# Patient Record
Sex: Female | Born: 1935 | Race: White | Hispanic: No | Smoking: Never smoker
Health system: Southern US, Community
[De-identification: ages and names within clinical notes are randomized; demographics above are authoritative.]

## PROBLEM LIST (undated history)

## (undated) DIAGNOSIS — E039 Hypothyroidism, unspecified: Secondary | ICD-10-CM

## (undated) DIAGNOSIS — M1990 Unspecified osteoarthritis, unspecified site: Secondary | ICD-10-CM

## (undated) DIAGNOSIS — I350 Nonrheumatic aortic (valve) stenosis: Secondary | ICD-10-CM

## (undated) DIAGNOSIS — E785 Hyperlipidemia, unspecified: Secondary | ICD-10-CM

## (undated) DIAGNOSIS — F0390 Unspecified dementia without behavioral disturbance: Secondary | ICD-10-CM

## (undated) DIAGNOSIS — N200 Calculus of kidney: Secondary | ICD-10-CM

## (undated) DIAGNOSIS — H401190 Primary open-angle glaucoma, unspecified eye, stage unspecified: Secondary | ICD-10-CM

## (undated) DIAGNOSIS — I639 Cerebral infarction, unspecified: Secondary | ICD-10-CM

## (undated) DIAGNOSIS — H3530 Unspecified macular degeneration: Secondary | ICD-10-CM

## (undated) HISTORY — PX: CATARACT EXTRACTION: SUR2

## (undated) HISTORY — PX: BREAST LUMPECTOMY: SHX2

## (undated) HISTORY — PX: CHOLECYSTECTOMY: SHX55

---

## 2001-08-05 ENCOUNTER — Encounter: Admission: RE | Admit: 2001-08-05 | Discharge: 2001-08-05 | Payer: Self-pay | Admitting: Family Medicine

## 2001-08-05 ENCOUNTER — Encounter: Payer: Self-pay | Admitting: Family Medicine

## 2003-04-15 DIAGNOSIS — I639 Cerebral infarction, unspecified: Secondary | ICD-10-CM

## 2003-04-15 HISTORY — DX: Cerebral infarction, unspecified: I63.9

## 2003-05-19 ENCOUNTER — Observation Stay (HOSPITAL_COMMUNITY): Admission: EM | Admit: 2003-05-19 | Discharge: 2003-05-21 | Payer: Self-pay | Admitting: Internal Medicine

## 2003-05-21 ENCOUNTER — Encounter: Payer: Self-pay | Admitting: Internal Medicine

## 2005-09-11 ENCOUNTER — Encounter: Payer: Self-pay | Admitting: Internal Medicine

## 2005-09-11 ENCOUNTER — Ambulatory Visit: Payer: Self-pay

## 2006-12-29 ENCOUNTER — Observation Stay (HOSPITAL_COMMUNITY): Admission: AD | Admit: 2006-12-29 | Discharge: 2007-01-01 | Payer: Self-pay | Admitting: Internal Medicine

## 2006-12-29 ENCOUNTER — Ambulatory Visit: Payer: Self-pay | Admitting: Cardiology

## 2006-12-30 ENCOUNTER — Encounter (INDEPENDENT_AMBULATORY_CARE_PROVIDER_SITE_OTHER): Payer: Self-pay | Admitting: Internal Medicine

## 2008-09-17 ENCOUNTER — Emergency Department (HOSPITAL_COMMUNITY): Admission: EM | Admit: 2008-09-17 | Discharge: 2008-09-17 | Payer: Self-pay | Admitting: Emergency Medicine

## 2009-09-06 ENCOUNTER — Emergency Department (HOSPITAL_COMMUNITY): Admission: EM | Admit: 2009-09-06 | Discharge: 2009-09-06 | Payer: Self-pay | Admitting: Emergency Medicine

## 2009-09-14 ENCOUNTER — Ambulatory Visit: Payer: Self-pay | Admitting: Internal Medicine

## 2010-05-07 ENCOUNTER — Inpatient Hospital Stay (HOSPITAL_COMMUNITY)
Admission: EM | Admit: 2010-05-07 | Discharge: 2010-05-17 | DRG: 291 | Disposition: A | Discharge status: Skilled Nursing Facility | Payer: Medicare Other | Attending: Internal Medicine | Admitting: Internal Medicine

## 2010-05-07 ENCOUNTER — Emergency Department: Payer: Self-pay | Admitting: Emergency Medicine

## 2010-05-07 DIAGNOSIS — E785 Hyperlipidemia, unspecified: Secondary | ICD-10-CM | POA: Diagnosis present

## 2010-05-07 DIAGNOSIS — I359 Nonrheumatic aortic valve disorder, unspecified: Secondary | ICD-10-CM | POA: Diagnosis present

## 2010-05-07 DIAGNOSIS — Z7982 Long term (current) use of aspirin: Secondary | ICD-10-CM

## 2010-05-07 DIAGNOSIS — N184 Chronic kidney disease, stage 4 (severe): Secondary | ICD-10-CM | POA: Diagnosis present

## 2010-05-07 DIAGNOSIS — Z66 Do not resuscitate: Secondary | ICD-10-CM | POA: Diagnosis present

## 2010-05-07 DIAGNOSIS — B373 Candidiasis of vulva and vagina: Secondary | ICD-10-CM | POA: Diagnosis present

## 2010-05-07 DIAGNOSIS — F3289 Other specified depressive episodes: Secondary | ICD-10-CM | POA: Diagnosis present

## 2010-05-07 DIAGNOSIS — M810 Age-related osteoporosis without current pathological fracture: Secondary | ICD-10-CM | POA: Diagnosis present

## 2010-05-07 DIAGNOSIS — I509 Heart failure, unspecified: Principal | ICD-10-CM | POA: Diagnosis present

## 2010-05-07 DIAGNOSIS — M1990 Unspecified osteoarthritis, unspecified site: Secondary | ICD-10-CM | POA: Diagnosis present

## 2010-05-07 DIAGNOSIS — K5900 Constipation, unspecified: Secondary | ICD-10-CM | POA: Diagnosis present

## 2010-05-07 DIAGNOSIS — H3530 Unspecified macular degeneration: Secondary | ICD-10-CM | POA: Diagnosis present

## 2010-05-07 DIAGNOSIS — R5381 Other malaise: Secondary | ICD-10-CM | POA: Diagnosis present

## 2010-05-07 DIAGNOSIS — B3731 Acute candidiasis of vulva and vagina: Secondary | ICD-10-CM | POA: Diagnosis present

## 2010-05-07 DIAGNOSIS — I5022 Chronic systolic (congestive) heart failure: Secondary | ICD-10-CM | POA: Diagnosis present

## 2010-05-07 DIAGNOSIS — F068 Other specified mental disorders due to known physiological condition: Secondary | ICD-10-CM | POA: Diagnosis present

## 2010-05-07 DIAGNOSIS — D509 Iron deficiency anemia, unspecified: Secondary | ICD-10-CM | POA: Diagnosis present

## 2010-05-07 DIAGNOSIS — F329 Major depressive disorder, single episode, unspecified: Secondary | ICD-10-CM | POA: Diagnosis present

## 2010-05-07 DIAGNOSIS — Z8673 Personal history of transient ischemic attack (TIA), and cerebral infarction without residual deficits: Secondary | ICD-10-CM

## 2010-05-07 DIAGNOSIS — R627 Adult failure to thrive: Secondary | ICD-10-CM | POA: Diagnosis present

## 2010-05-07 DIAGNOSIS — J189 Pneumonia, unspecified organism: Secondary | ICD-10-CM | POA: Diagnosis present

## 2010-05-07 DIAGNOSIS — E039 Hypothyroidism, unspecified: Secondary | ICD-10-CM | POA: Diagnosis present

## 2010-05-08 LAB — CBC
Hemoglobin: 10.2 g/dL — ABNORMAL LOW (ref 12.0–15.0)
MCH: 31.2 pg (ref 26.0–34.0)
MCV: 94.8 fL (ref 78.0–100.0)
Platelets: 175 10*3/uL (ref 150–400)
RBC: 3.27 MIL/uL — ABNORMAL LOW (ref 3.87–5.11)

## 2010-05-08 LAB — COMPREHENSIVE METABOLIC PANEL
Alkaline Phosphatase: 41 U/L (ref 39–117)
BUN: 27 mg/dL — ABNORMAL HIGH (ref 6–23)
CO2: 24 mEq/L (ref 19–32)
Chloride: 96 mEq/L (ref 96–112)
GFR calc non Af Amer: 39 mL/min — ABNORMAL LOW (ref >60)
Glucose, Bld: 105 mg/dL — ABNORMAL HIGH (ref 70–99)
Potassium: 3.3 mEq/L — ABNORMAL LOW (ref 3.5–5.1)
Total Bilirubin: 0.7 mg/dL (ref 0.3–1.2)

## 2010-05-08 LAB — BRAIN NATRIURETIC PEPTIDE: Pro B Natriuretic peptide (BNP): 728 pg/mL — ABNORMAL HIGH (ref 0.0–100.0)

## 2010-05-08 LAB — PROCALCITONIN: Procalcitonin: 1.58 ng/mL

## 2010-05-09 LAB — BASIC METABOLIC PANEL
BUN: 31 mg/dL — ABNORMAL HIGH (ref 6–23)
CO2: 27 mEq/L (ref 19–32)
Chloride: 96 mEq/L (ref 96–112)
Creatinine, Ser: 1.45 mg/dL — ABNORMAL HIGH (ref 0.4–1.2)
Potassium: 3.6 mEq/L (ref 3.5–5.1)

## 2010-05-09 LAB — CBC
Hemoglobin: 9.7 g/dL — ABNORMAL LOW (ref 12.0–15.0)
MCH: 31 pg (ref 26.0–34.0)
MCV: 94.2 fL (ref 78.0–100.0)
RBC: 3.13 MIL/uL — ABNORMAL LOW (ref 3.87–5.11)

## 2010-05-10 LAB — CBC
HCT: 27.8 % — ABNORMAL LOW (ref 36.0–46.0)
MCH: 31.4 pg (ref 26.0–34.0)
MCV: 94.9 fL (ref 78.0–100.0)
Platelets: 213 10*3/uL (ref 150–400)
RBC: 2.93 MIL/uL — ABNORMAL LOW (ref 3.87–5.11)

## 2010-05-10 LAB — BASIC METABOLIC PANEL
CO2: 27 mEq/L (ref 19–32)
Calcium: 8.6 mg/dL (ref 8.4–10.5)
GFR calc non Af Amer: 37 mL/min — ABNORMAL LOW (ref >60)
Potassium: 3.6 mEq/L (ref 3.5–5.1)

## 2010-05-11 LAB — BASIC METABOLIC PANEL
CO2: 25 mEq/L (ref 19–32)
Chloride: 96 mEq/L (ref 96–112)
GFR calc Af Amer: 46 mL/min — ABNORMAL LOW (ref >60)
Sodium: 133 mEq/L — ABNORMAL LOW (ref 135–145)

## 2010-05-11 LAB — CBC
HCT: 27.5 % — ABNORMAL LOW (ref 36.0–46.0)
Hemoglobin: 9.2 g/dL — ABNORMAL LOW (ref 12.0–15.0)
RBC: 2.93 MIL/uL — ABNORMAL LOW (ref 3.87–5.11)

## 2010-05-11 LAB — TYPE AND SCREEN: Antibody Screen: NEGATIVE

## 2010-05-11 LAB — ABO/RH: ABO/RH(D): O POS

## 2010-05-12 LAB — COMPREHENSIVE METABOLIC PANEL
AST: 43 U/L — ABNORMAL HIGH (ref 0–37)
Albumin: 2.3 g/dL — ABNORMAL LOW (ref 3.5–5.2)
Chloride: 94 mEq/L — ABNORMAL LOW (ref 96–112)
Creatinine, Ser: 1.27 mg/dL — ABNORMAL HIGH (ref 0.4–1.2)
GFR calc Af Amer: 48 mL/min — ABNORMAL LOW (ref >60)
Potassium: 4 mEq/L (ref 3.5–5.1)
Total Bilirubin: 0.4 mg/dL (ref 0.3–1.2)

## 2010-05-12 LAB — CBC
MCH: 31.4 pg (ref 26.0–34.0)
Platelets: 283 10*3/uL (ref 150–400)
RBC: 2.83 MIL/uL — ABNORMAL LOW (ref 3.87–5.11)
WBC: 8.5 10*3/uL (ref 4.0–10.5)

## 2010-05-12 LAB — DIFFERENTIAL
Basophils Absolute: 0.1 10*3/uL (ref 0.0–0.1)
Eosinophils Absolute: 0.6 10*3/uL (ref 0.0–0.7)
Lymphocytes Relative: 14 % (ref 12–46)
Neutrophils Relative %: 68 % (ref 43–77)

## 2010-05-12 NOTE — H&P (Signed)
[name] NO.:  [id number]  MEDICAL RECORD NO.:  [id number]          PATIENT TYPE:  INP  LOCATION:  2920                         FACILITY:  [facility]  PHYSICIAN:  Gina Pounds, MD       DATE OF BIRTH:  [birth year]  DATE OF ADMISSION:  05/07/2010 DATE OF DISCHARGE:                             HISTORY & PHYSICAL   PRIMARY CARE PROVIDER:  Gwen Pounds, MD  CARDIOLOGIST:  Gina Buckles. Bensimhon, MD  CHIEF COMPLAINT:  Short of breath, dyspnea on exertion, hypoxia, congestive heart failure, increased troponin I, and pneumonia.  HISTORY OF PRESENT ILLNESS:  A 77 year old female with critical aortic stenosis, last valve area was 0.74 in 2008, known history of dementia, history of stroke, hypothyroidism, failure to thrive, and multiple other medical issues.  Had GI bug including diarrhea, nausea, vomiting over the weekend.  Presents today at [facility] with 84% sats, rhonchi, dyspnea on exertion, respiratory rate of 36, and moderate respiratory distress, DuoNeb given.  Chest x-ray confirmed congestive heart failure versus pneumonia versus both.  BNP was 16,000.  Sats came up to 90% and then higher with treatment.  Troponin I was elevated and she was given Rocephin, azithromycin, and blood cultures were obtained. I was called as family requested.  The family requested that the patient be moved close to home and I accepted her over to [facility].  Here in the Step-Down Unit after arrival, she is on 100% nonrebreather.  She is still tachypneic and ill, although she is mentally alert, answering questions, and actually making some jokes.  She will be admitted for evaluation and treatment and comfort measures.  The plan right now is to provide her with the tools to get better.  If she gets better, wonderful.  If she does not, progress to a Hospice Palliative Care approach.  The heparin drip and the nitroglycerin that she was on will be stopped  and she only will be on Lovenox.  The tools that will be provided will be just short of pressors, just short of intubation, and she is do not resuscitate, this is confirmed.  PAST MEDICAL HISTORY: 1. Critical aortic stenosis with mild aortic insufficiency, last valve     area is 0.74. 2. Failure to thrive. 3. Deconditioning. 4. Dementia. 5. Mild depression. 6. Iron deficiency anemia. 7. Sundowning with some agitation, controlled on Seroquel. 8. 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 9. Open-angle glaucoma. 10.Hyperlipidemia. 11.History nephrolithiasis. 12.Macular degeneration. 13.Osteoarthritis. 14.Cataract surgery. 15.Benign breast biopsy. 16.Osteoporosis. 17.Vitamin D deficiency. 18.History of urinary tract infections. 19.History of L1 compression fracture.  ALLERGIES:  PENICILLIN, flu and strep and TRAMADOL.  MEDICATION LIST: 1. Aspirin 325 daily. 2. Vitamin C 500 daily. 3. Iron 325 daily. 4. Synthroid 112 daily. 5. Timolol 0.5 one drop, both eyes at bedtime. 6. Travatan 0.004 solution daily. 7. Vitamin D 50,000 weekly. 8. Miacalcin nasal spray alternate nostrils on every other day basis. 9. Tylenol 1000 t.i.d. 10.Vitamin D 1000 2 tablets daily. 11.Multivitamin 1 daily. 12.Zoloft 25 daily. 13.MiraLax as directed. 14.Seroquel 25 at  bedtime.  SOCIAL HISTORY:  [name] is her daughter, telephone number is [phone], that is her cell phone.  She resides at the [facility] in [place].  FAMILY HISTORY:  Father died at the age of 90 of an accident.  Mother died at the age of 8 of stroke and diabetes.  Sibling with diabetes and coronary disease.  REVIEW OF SYSTEMS:  Recent nausea, vomiting, diarrhea, GI bug.  She denies any fevers, chills, or night sweats.  About 3-1/2 years ago, she had significant weight loss, but her weight has been relatively stable, last weight in my office was 161 Acosta almost 7-8 months ago, and she has actually gained about  8 Acosta in the last few months.  She wears glasses.  She has dementia and is having dyspnea on exertion, orthopnea, and current short of breath.  All other review of systems were obtained and negative.  PHYSICAL EXAMINATION:  VITAL SIGNS:  Temperature 98.2, heart rate is 84, respiratory rate is 22, blood pressure is 158/83, sats are 98% on 100% nonrebreather. GENERAL:  Alert, awake.  She is talking.  She has got underlying dementia. HEENT:  Oropharynx is dry. NECK:  No JVD. PULMONARY:  Positive rales, positive crackles. CARDIAC:  Regular with a whistling tight murmur. ABDOMEN:  Soft. EXTREMITIES:  No edema but is tender in the right shin without any corresponding element.  Ancillary data shows BNP is 16,886, troponin I is 0.36.  White count 6.7, hemoglobin 10.7, platelet count 165.  Sodium 135, potassium 3.7, chloride 101, bicarb 26, BUN 36, creatinine 1.51, glucose 124, calcium 8.5.  Alk phos 42, ALT 16, AST 33, albumin 3.2.  Urinalysis negative. Chest x-ray; congestive heart failure, pulmonary edema, and pneumonia. EKG shows normal sinus rhythm, it is actually incredibly normal looking EKG.  ASSESSMENT:  This is an elderly female being admitted with congestive heart failure, question low-grade myocardial infarction in the setting of critical aortic stenosis and what seems to be a clinical pneumonia after recent GI bug, question underlying aspiration.  PLAN: 1. She is already admitted to Step-Down, we will continue that.  We     will continue supportive care.  Her current critical aortic     stenosis is certainly nonoperable.  Her last Cardiology evaluation     was with Dr. Gala Romney and that was remarkably 3 plus years ago.     She will be maintained on do not resuscitate, that order has been     signed. 2. Antibiotics have already been given.  She will be continued on     Rocephin and azithromycin.  She will be given nebs.  She will be     given pulmonary toilet.  We will  follow up on cultures.  She will     be continued on oxygen to maintain her sats well above 90. 3. We will follow up on labs.  Labs ordered for the morning include     procalcitonin, BNP, TSH, CK, troponin I, CMET, CBC.  EKG and chest     x-ray will be obtained in the morning as well.  I will change the     heparin drip to Lovenox as she is not a long-term anticoagulation     patient and she is not having current chest pain. 4. We will rule out MI as stated above. 5. Hopefully, most of this is pneumonia and she will respond to the     treatments listed. 6. We will attempt to manage fluid status, it is very  difficult as she     does not have JVD.  She does not have lower extremity edema.  I am     not able to actually look at the x-ray that was done at [facility]     but based on the BNP and based on how bad her critical aortic     stenosis is with presumed increasing closure of the valve over     time.  I do not want to over diurese her. I want to provide     her with enough diuresis that we improve her respiratory function.     It is going to be very difficult to carefully balance this and we     will do our best. 7. If she survives this hospital stay, we might have to increase her     level of care and she might need a long-term care facility, nursing     facility. 8. We will follow her creatinine underlying kidney disease as her last     creatinine in my office was 1.4 and currently at 1.5, this is not     big deal and not far away from her baseline. 9. Her hemoglobin is 10.7 and that is stable. 10.Her white count is 6.7, it is certainly not that worrisome either. 11.She has a high mortality risk, family is aware, and again we are     going to provide her with the tools to get better.  If she is able     to get better, we will support.  If she is not, we will progress to     hospice palliative care approach.     Gina Pounds, MD     JMR/MEDQ  D:  05/07/2010  T:  05/08/2010   Job:  [id number]  cc:   Gina Buckles. Bensimhon, MD  Electronically Signed by Creola Corn MD on 05/12/2010 08:29:37 PM

## 2010-05-13 LAB — COMPREHENSIVE METABOLIC PANEL
Alkaline Phosphatase: 51 U/L (ref 39–117)
BUN: 35 mg/dL — ABNORMAL HIGH (ref 6–23)
GFR calc non Af Amer: 37 mL/min — ABNORMAL LOW (ref >60)
Glucose, Bld: 98 mg/dL (ref 70–99)
Potassium: 4 mEq/L (ref 3.5–5.1)
Total Bilirubin: 0.4 mg/dL (ref 0.3–1.2)
Total Protein: 6.5 g/dL (ref 6.0–8.3)

## 2010-05-13 LAB — DIFFERENTIAL
Basophils Absolute: 0.1 10*3/uL (ref 0.0–0.1)
Eosinophils Absolute: 0.6 10*3/uL (ref 0.0–0.7)
Lymphocytes Relative: 12 % (ref 12–46)
Neutrophils Relative %: 68 % (ref 43–77)

## 2010-05-13 LAB — CBC
Platelets: 310 10*3/uL (ref 150–400)
RBC: 2.94 MIL/uL — ABNORMAL LOW (ref 3.87–5.11)
WBC: 7 10*3/uL (ref 4.0–10.5)

## 2010-05-14 LAB — COMPREHENSIVE METABOLIC PANEL
ALT: 42 U/L — ABNORMAL HIGH (ref 0–35)
Alkaline Phosphatase: 53 U/L (ref 39–117)
BUN: 37 mg/dL — ABNORMAL HIGH (ref 6–23)
CO2: 27 mEq/L (ref 19–32)
Calcium: 9 mg/dL (ref 8.4–10.5)
GFR calc non Af Amer: 38 mL/min — ABNORMAL LOW (ref >60)
Glucose, Bld: 99 mg/dL (ref 70–99)
Sodium: 136 mEq/L (ref 135–145)

## 2010-05-14 LAB — CBC
HCT: 28.6 % — ABNORMAL LOW (ref 36.0–46.0)
Hemoglobin: 9.1 g/dL — ABNORMAL LOW (ref 12.0–15.0)
WBC: 7.9 10*3/uL (ref 4.0–10.5)

## 2010-05-15 ENCOUNTER — Inpatient Hospital Stay (HOSPITAL_COMMUNITY): Payer: Medicare Other

## 2010-05-15 LAB — CBC
HCT: 27.3 % — ABNORMAL LOW (ref 36.0–46.0)
Platelets: 334 10*3/uL (ref 150–400)
RBC: 2.82 MIL/uL — ABNORMAL LOW (ref 3.87–5.11)
RDW: 12.5 % (ref 11.5–15.5)
WBC: 8 10*3/uL (ref 4.0–10.5)

## 2010-05-15 LAB — COMPREHENSIVE METABOLIC PANEL
ALT: 38 U/L — ABNORMAL HIGH (ref 0–35)
Albumin: 2.4 g/dL — ABNORMAL LOW (ref 3.5–5.2)
Alkaline Phosphatase: 53 U/L (ref 39–117)
Chloride: 98 mEq/L (ref 96–112)
Potassium: 4.4 mEq/L (ref 3.5–5.1)
Sodium: 136 mEq/L (ref 135–145)
Total Bilirubin: 0.3 mg/dL (ref 0.3–1.2)
Total Protein: 6.7 g/dL (ref 6.0–8.3)

## 2010-05-16 LAB — CBC
HCT: 27.6 % — ABNORMAL LOW (ref 36.0–46.0)
Hemoglobin: 9 g/dL — ABNORMAL LOW (ref 12.0–15.0)
RBC: 2.88 MIL/uL — ABNORMAL LOW (ref 3.87–5.11)
RDW: 12.5 % (ref 11.5–15.5)
WBC: 7 10*3/uL (ref 4.0–10.5)

## 2010-05-16 LAB — COMPREHENSIVE METABOLIC PANEL
BUN: 28 mg/dL — ABNORMAL HIGH (ref 6–23)
CO2: 29 mEq/L (ref 19–32)
Calcium: 8.8 mg/dL (ref 8.4–10.5)
Creatinine, Ser: 1.14 mg/dL (ref 0.4–1.2)
GFR calc non Af Amer: 44 mL/min — ABNORMAL LOW (ref >60)
Glucose, Bld: 86 mg/dL (ref 70–99)

## 2010-05-22 NOTE — Discharge Summary (Signed)
NAMEDELANY, [name] NO.:  [id number]  MEDICAL RECORD NO.:  [id number]           PATIENT TYPE:  I  LOCATION:  5502                         FACILITY:  [facility]  PHYSICIAN:  Gwen Pounds, MD       DATE OF BIRTH:  [birth year]  DATE OF ADMISSION:  05/07/2010 DATE OF DISCHARGE:  05/17/2010                              DISCHARGE SUMMARY   PRIMARY CARE PROVIDER:  Gwen Pounds, MD  CARDIOLOGIST:  Bevelyn Buckles. Bensimhon, MD  DISCHARGE DIAGNOSES:  Include: 1. Pneumonia. 2. Hypoxia requiring oxygen. 3. Recurrent aspiration events. 4. Critical and severe aortic stenosis, not a surgical candidate. 5. Cough. 6. Mild congestive heart failure. 7. Deconditioning. 8. Yeast rash in the perineum. 9. Chronic dysphagia. 10.Chronic and stable anemia. 11.Constipation. 12.Dementia. 13.Osteoporosis with history of compression fractures. 14.Failure to thrive. 15.Mild depression. 16.History of sundowning with some agitation, controlled on Seroquel. 17.History of cerebrovascular accident and left internal capsule     cerebrovascular accident in February 2005. 18.Open-angle glaucoma. 19.Hyperlipidemia. 20.History of nephrolithiasis. 21.Macular degeneration. 22.Osteoarthritis. 23.History of cataract surgery. 24.Benign breast biopsy. 25.Vitamin D deficiency. 26.History of urinary tract infections. 27.Status post L1 compression fracture.  DISCHARGE MEDICATION:  List includes: 1. Tylenol 325 mg 1-2 tablets every 6 hours as needed for pain or     fever. 2. Sertraline 25 mg p.o. daily. 3. Vitamin C 500 daily. 4. Iron 325 daily. 5. Loperamide 2 mg 1-2 capsules daily as needed for diarrhea. 6. Albuterol and/or ipratropium nebulizer treatments q.4-6 h p.r.n.     shortness of breath, cough, or congestion. 7. Incentive spirometry q.1 hour while awake. 8. Oxygen 2 liters nasal cannula continuous with the proposed slow and     long wean. 9. Seroquel 25 mg at bedtime. 10.Timolol  ophthalmic solution 0.5% drops 1 drop each eye at bedtime. 11.Ensure vanilla flavor one t.i.d. with meals. 12.Docusate sodium 100 mg 2 tablets at bedtime, hold for diarrhea. 13.MiraLax 17 g in 88 ounces of water q.a.m., hold if diarrhea or     regular. 14.Vicodin 5/500 one tablet q.6 h p.r.n. pain. 15.Travatan ophthalmologic solution 0.004% one drop each eye at     bedtime. 16.Vitamin D 50,000 units weekly. 17.Miacalcin nasal spray alternate nostrils on an every-other-day     basis. 18.Vitamin D 1000 units 2 tablets daily on top of the 50,000 weekly. 19.Phenergan 25 mg every 6 hours p.r.n. extreme nausea. 20.Zofran 4 mg orally every 8 hours as needed for mild to moderate     nausea. 21.Nystatin 100,000 units per gram powder applied b.i.d. to perennial     area as needed for yeast infection.  PROCEDURES:  Include medical management and multiple contiguous x-rays.  HISTORY OF PRESENT ILLNESS:  Briefly, [name] is a longstanding patient of mine who is 77 years old with critical AS with her last valve area of 0.74 in 2008, history of mild-to-moderate dementia, history of stroke, who had a GI viral infection the weekend prior to admission including diarrhea, nausea, and vomiting.  She presented to [facility] on May 07, 2010 with 84% sats, rhonchi, dyspnea  on exertion, mild respiratory distress with respiratory rate 36.  DuoNeb was given.  Chest x-ray confirmed congestive heart failure versus pneumonia versus both.  With treatment, her oxygen sats came up to 90% or higher.  Due to her family being in [place] and that this may be a terminal event, I was called and family requested the transfer to [facility], which I gladly accepted.  We moved her to step-down.  I saw her that night.  She was on 100% non-rebreather.  She was tachypneic.  She was ill, although she was mentally alert, answering questions, and actually making some jokes.  The initial plan  was to provide her with antibiotics and pulmonary toilet and oxygen and do not escalate care beyond that and use those tools to see if she will get better and if she does wonderful and if she does not, then we would progress to hospice palliative care approach.  She was placed on heparin drip at the outside facility.  She was placed on nitroglycerin drip at the outside facility.  These were both stopped and changed to Lovenox as she was not really going to be treated for anything cardiac at that point.  The reason why those medicines were initially used is her BNP was extremely elevated and the troponin I was 0.36.  The rest of her labs at that time showed a white count of 6.7, hemoglobin 10.7, creatinine 1.51 and the rest of her labs were unremarkable.  Her EKG was also unremarkable.  CODE STATUS:  She has gotten out of facility DNR signed and we will go with the patient.  HOSPITAL COURSE:  [name] is an elderly female admitted on May 07, 2010 with congestive heart failure, questionable low-grade myocardial infarction in the setting of critical aortic stenosis and a clinical pneumonia after a recent GI bug with question of underlying aspiration.She was admitted to the step-down unit.  She was given IV Rocephin, IV azithromycin, and supportive care.  She was placed on aggressive pulmonary toilet.  Remarkably over the next several days to little over a week, she actually improved.  First x-ray did show bibasilar opacities, left greater than right and over the next several x-rays showed continued regression of the left lung base.  The last chest x-ray that was on the hospital on May 15, 2010 showed interval clearing of patchy changes to the right lung, persistent patchy changes to the left mid to left lower lobe may represent infiltrate in the proper clinical setting.  Pulmonary vascular congestion.  Cardiomegaly.  Tortuous calcified aorta.  Over the course of the hospital  stay, she was seen and evaluated by respiratory therapy, who recommended a dysphagia III diet with thin liquids, which equals a mechanical soft diet with upright posture when alert and supervision while eating.  She is deemed not to be a peg tube candidate.  She had several witnessed aspiration events throughout the hospital stay and clearly this is going to be an issue over the course of time and we may need to thicken some of the liquids, but at this point, she seems to be slowly improving.  She has got nonoperable critical and severe aortic stenosis.  Her last valve area was pretty poor 3+ years ago.  Her last cardiology evaluation was at that time as well.  There is nothing further to do with this aortic valve.  She has got more interesting murmurs here.  She was maintained a do not resuscitate and was intermittently  diuresed based on congestive heart failure related to some of her cardiopulmonary issues. Her current fluid status is stable at the current time.  She did receive Lasix after that last x-ray only 10 mg and she tolerated it well.  Again, she did survive.  She did do better with originally anticipated than expected and physical and occupational therapy did see her for her underlying deconditioning and recommended skilled nursing facility on discharge.  She was able to work with them.  She does have some underlying dementia and she is making progress.  Her ultimate goal is to end up back at the [facility] in [place] at some point in the future.  She remains hypoxic.  She was weaned off her oxygen yesterday and had sats greater than 90%.  Unfortunately, overnight on May 16, 2010 and May 17, 2010, she had a little bit of nausea and some IV Zofran was given and then later in the morning, she got anxious, she got little short of breath, and her sats were 88% on room air.  She got a breathing treatment and was placed back on the oxygen and by the time I am seeing her  around 9 a.m. in the morning, she is on FIO2 at 2.5 liters and her sats are fine.  She is no longer anxious.  She is no longer short of breath.  She looks back to her baseline.  All her vital signs are stable.  She is alert and comfortable.  Her blood pressure currently is 117/67.  Her rhonchi is the same as usual.  She is not really tachypneic.  Her murmurs is the same, really has not changed.  The rest of her physical exam is unremarkable.  PLAN:  Currently for her pneumonia, she is going to finish her Rocephin today and will not need any further outpatient antibiotics.  For her aspiration events, we are going to do the best we can on the dysphagia III diet with thin liquids and monitor for further events and potentially do speech therapy at the skilled nursing facility.  For her hypoxemia, she is going to stay on the FIO2 and have a slow wean.  For her aortic stenosis, we are just going to continue DNR and supportive care.  For her deconditioning, it is okay for her to be discharged to skilled nursing facility today.  She is currently at her baseline for the hospitalization at the moment and hopefully, she will continue to improve and maybe get her wish in terms of long-term recovery.  The nurse brought up to me that she has got a yeasty rash in her groin.  I took a look at it, it is raw and irritated.  She will get one dose of Diflucan followed by nystatin powder.  Important laboratory data throughout this hospital stay shows MRSA screening was negative.  Hemoglobins have been between 9.0 and 10.2. Creatinine were between 1.14, which was the last creatinine checked and 1.5.  She is currently stable in that regard.  She has slight elevation of her LFTs and those have come back down to normal.  She was typed and screened, and she is O+ for the possibility that she may have needed a transfusion during the hospital stay and that never actually did occur or need to happen.  TSH was done  on May 08, 2010 was 1.9 and stable. Her peak troponin was 0.15 with CK, CK-MB, and index all normal.  Pro calcitonin level was 1.58 and beta-natriuretic peptide was 728  when checked.  [name] again, all of her vital signs are stable and she looks great at the moment.  Do not know really what happened to her overnight, whether she got nausea and had the side effects of the Zofran while being still relatively hypoxic and potentially the small aspiration event, but she is clinically stable at this current time and ready for discharge.     Gwen Pounds, MD     JMR/MEDQ  D:  05/17/2010  T:  05/17/2010  Job:  [id number]  cc:   Bevelyn Buckles. Bensimhon, MD  Electronically Signed by Creola Corn MD on 05/22/2010 09:55:37 AM

## 2010-05-25 ENCOUNTER — Emergency Department (HOSPITAL_COMMUNITY): Admission: EM | Admit: 2010-05-25 | Payer: Medicare Other | Source: Home / Self Care

## 2010-05-25 ENCOUNTER — Emergency Department (HOSPITAL_COMMUNITY): Payer: Medicare Other

## 2010-07-01 LAB — COMPREHENSIVE METABOLIC PANEL
BUN: 23 mg/dL (ref 6–23)
CO2: 25 mEq/L (ref 19–32)
Chloride: 102 mEq/L (ref 96–112)
Creatinine, Ser: 1.28 mg/dL — ABNORMAL HIGH (ref 0.4–1.2)
GFR calc non Af Amer: 39 mL/min — ABNORMAL LOW (ref >60)
Total Bilirubin: 0.5 mg/dL (ref 0.3–1.2)

## 2010-07-01 LAB — DIFFERENTIAL
Basophils Absolute: 0 10*3/uL (ref 0.0–0.1)
Lymphocytes Relative: 14 % (ref 12–46)
Neutro Abs: 7.7 10*3/uL (ref 1.7–7.7)
Neutrophils Relative %: 77 % (ref 43–77)

## 2010-07-01 LAB — CBC
HCT: 34.8 % — ABNORMAL LOW (ref 36.0–46.0)
MCV: 93.8 fL (ref 78.0–100.0)
Platelets: 246 10*3/uL (ref 150–400)
RBC: 3.72 MIL/uL — ABNORMAL LOW (ref 3.87–5.11)
WBC: 9.9 10*3/uL (ref 4.0–10.5)

## 2010-07-01 LAB — URINALYSIS, ROUTINE W REFLEX MICROSCOPIC
Hgb urine dipstick: NEGATIVE
Specific Gravity, Urine: 1.023 (ref 1.005–1.030)
Urobilinogen, UA: 0.2 mg/dL (ref 0.0–1.0)
pH: 6 (ref 5.0–8.0)

## 2010-07-01 LAB — URINE CULTURE

## 2010-07-01 LAB — URINE MICROSCOPIC-ADD ON

## 2010-07-22 LAB — BASIC METABOLIC PANEL
Chloride: 102 mEq/L (ref 96–112)
GFR calc Af Amer: 45 mL/min — ABNORMAL LOW (ref >60)
Potassium: 4.1 mEq/L (ref 3.5–5.1)

## 2010-07-22 LAB — CBC
HCT: 36.1 % (ref 36.0–46.0)
MCV: 91.7 fL (ref 78.0–100.0)
RBC: 3.94 MIL/uL (ref 3.87–5.11)
WBC: 6.9 10*3/uL (ref 4.0–10.5)

## 2010-07-22 LAB — DIFFERENTIAL
Basophils Absolute: 0 10*3/uL (ref 0.0–0.1)
Lymphocytes Relative: 22 % (ref 12–46)
Monocytes Absolute: 0.6 10*3/uL (ref 0.1–1.0)
Monocytes Relative: 9 % (ref 3–12)
Neutro Abs: 4.3 10*3/uL (ref 1.7–7.7)

## 2010-08-27 NOTE — Discharge Summary (Signed)
[name], [name]                   ACCOUNT NO.:  [id number]   MEDICAL RECORD NO.:  [id number]          PATIENT TYPE:  OBV   LOCATION:  6715                         FACILITY:  [facility]   PHYSICIAN:  Gwen Pounds, MD       DATE OF BIRTH:  Nov 11, 1914   DATE OF ADMISSION:  12/29/2006  DATE OF DISCHARGE:  12/31/2006                               DISCHARGE SUMMARY   CARDIOLOGIST:  Bevelyn Buckles. Bensimhon, M.D.   DISCHARGE DIAGNOSES:  1. Critical aortic stenosis.  2. Failure to thrive and deconditioning.  3. Weight loss and anorexia while living on her own who is eating      extremely well and thriving well in [facility] hospital.  4. Mild cognitive decline.  5. Depression, presumed mild.  6. Iron-deficiency anemia.  7. History of stroke.  8. Open-angle glaucoma.  9. Hyperlipidemia.  10.History of kidney stones.  11.Macular degeneration.  12.Osteoarthritis.  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  14.Mild aortic insufficiency.  15.History of cataract surgery x2.  16.History of benign breast biopsy.   PROCEDURES:  1. PPD placed on December 31, 2006 at 9:30 a.m. to Gina left forearm.  2. 2-D echo dated December 30, 2006 with ejection fraction 65%.  No      evidence of wall motion abnormalities.  Aortic valve thickness      markedly increased, markedly reduced aortic valve leaflet      excursions.  Gina valve gradient was 46 mmHg.  Gina valve area was      0.74 sq cm.  This was compatible with critical aortic stenosis and      mild aortic insufficiency.  3. Chest x-ray on December 29, 2006.  No active lung disease and      borderline cardiomegaly.  4. EKG on admission shows sinus rhythm with bigeminy.  Telemetry      monitoring just showed normal sinus rhythm.  5. Physical therapy and occupational therapy, speech therapy, and case      worker consults.   Out-of -facility DNR is signed.   DISCHARGE MEDICATIONS:  1. Synthroid 100 mcg p.o.  daily.  2. Lumigan eyedrops one drop bilateral at bedtime.  3. Timolol eye drops one drop bilateral at bedtime.  4. Enteric coated aspirin 325 mg p.o. daily.  5. Vitamin D 50,000 units weekly.  6. Simvastatin 20 mg p.o. daily.  7. Iron sulfate 325 mg p.o. b.i.d. for one month, Gina daily      thereafter.  8. Zoloft 25 mg p.o. daily.  9. Multivitamins one p.o. daily.  10.Strawberry Ensure one can with evening meal.  11.Aranesp given while in [facility] hospital.  12.Hearing loss and wears bilateral hearing aids.   HISTORY OF PRESENT ILLNESS:  Briefly, [name] is a very pleasant  77 year old female who was brought to my attention on December 29, 2006, feeling ill, fatigued, less functional, continued weight loss of  70 to 20 pounds over Gina last one year due to decreased appetite and  decreased consumption p.o.  This  is on a known woman with significant  aortic stenosis.  Differential diagnosis in my office included markedly  worsening valve, worsening anemia with some underlying cancer, potential  dementia, thyroid issues, or underlying illness.  Other considerations  were given side effects of some medications or an underlying viral  illness.  She was admitted for further evaluation and however.  There  was nothing obvious on physical examination when I saw her in [facility] office  and her vital signs were stable.  Her heart rate was 68 and she has  satted 98% on room air.  Again she was admitted for further evaluation  and treatment.   HOSPITAL COURSE:  [name] was admitted via my office on December 29, 2006 with failure to thrive, weakness, anorexia, 20-pound weight  loss, dyspnea on exertion and fatigue.  A thorough check of her  laboratory data was obtained.  BNP was 386.  CK and troponin-I were  negative.  BMET was negative.  CBC was negative.  Her hemoglobin was  10.5, compatible with prior hemoglobins.  Her LFTs were fine except for  a slightly low albumin at 2.9.   Telemetry showed normal sinus rhythm.  Heart rate was 74. Chest x-ray and EKG were unremarkable except for Gina  bigeminy which resolved and orders were obtained for a 2-D  echocardiogram and physical therapy and occupational therapy.  Gina  following morning on rounds, she was seen and other labs were reviewed.  A urinalysis was negative.  B12 level was appropriate.  TSH was  appropriate.  Cancer markers to include CA19-9, CEA and CA125 were all  negative.  Physical examination was otherwise benign.  When doing a mini  mental status exam on her, she has some minor trouble with recall and  knowing what year it is, but is very clear on every other aspect.  So  depression and myocardial infarction were concerned.  She was started on  a low dose of Zoloft and will follow Gina results of this.  She was seen  by physical therapy and occupational therapy and they recommended Gina  patient either go home with 24 hour supervision or assisted living  facility.  At this point I had a conversation with [name] and she  elects to go to assisted living and requests [name].   Nutrition saw Gina patient on December 30, 2006 and said Gina goal was  adequate p.o. with meals and supplement to meet 100% of estimated needs.  She recommended starting on a multivitamin daily.  She recommended that  her current weight be obtained at about 135 pounds.  Also recommend  Strawberry Ensure one can with evening meal and at bedtime.  Gina  following morning on rounds, her labs remained stable except for a  hemoglobin drop just a little bit to 9.5.  Her 2-D echocardiogram was  reviewed and showed critical aortic stenosis.  At that moment, had a  long discussion about what this means.  Both Gina patient and myself have  decided that she is not a surgical candidate.  Gina patient is aware she  had already been made a do not resuscitate and does not want to be  hooked to any machines at all.  Both of realize that her options  are  limited based on her perfect vital signs, I do not necessarily know that  she would get much benefit from adding any medications.  We will need to  avoid diuretics in this patient with  Gina aortic stenosis. This could be  definitely Gina cause of dyspnea on exertion and worsening failure over  time.  Of note, her valve area was about 0.99 about a year ago and it is  0.74 this year.  If it continues to tighten at its current pace,  unfortunately she should get sicker over a pretty short period of time.  I am not going to conjecture as to what that time period will be at this  point.  Will potentially follow this up with another 2-D echocardiogram  in six months to a year.  For Gina weakness and failure to thrive,  anorexia and weight loss, we felt that this was probably all due to  advancing age and early dementia.  And potentially some underlying  depression. A long discussion was had and continued about her going to  assisted living, staying on Gina Zoloft, improving her underlying  nutrition and to be in a facility where she can get Gina care that she  needs and pushed towards Gina thriving.   For her underlying anemia, her hemoglobin was 9.2 on Gina day of  discharge.  RBC folate was 1153.  Ferritin 458.  Haptoglobin 356, B12  445, iron levels and total iron binding capacity were low.  LVH was 136.  I do not necessarily think that she is that great of a colonoscopy  candidate at this point as well.  I ordered down a heme-check of Gina  stools. They were done as an outpatient prior and were all negative, but  I do not have any current results from [facility] hospital.  I do not see that  any were done.  My guess is that [name] went to Gina bathroom and  because she has been so independent in Gina past, did not feel that she  had to tell anybody.  At this point in time, I will just start iron  b.i.d. for a month and then daily thereafter and just try to maintain  her iron levels.   [name] was seen on January 01, 2007.  All her vital signs were  stable.  Temperature 98.9, heart rate 92, respiratory rate 20, blood  pressure 115/73, 98% on room air.  It was deemed she was medically ready  and stable for discharge. Out of facility DNR form was filled out.  Her  [facility] standing orders were filled out.  PPD was confirmed  that it was placed on her left arm Gina prior day and she will be leaving  this morning to afternoon with Gina idea that she will come back and see  me in about three weeks for continued outpatient management.      Gwen Pounds, MD  Electronically Signed     JMR/MEDQ  D:  01/01/2007  T:  01/01/2007  Job:  [id number]

## 2010-08-27 NOTE — H&P (Signed)
[name], [name]                   ACCOUNT NO.:  [id number]   MEDICAL RECORD NO.:  [id number]          PATIENT TYPE:  INP   LOCATION:  6715                         FACILITY:  [facility]   PHYSICIAN:  Gwen Pounds, MD       DATE OF BIRTH:  01/29/1915   DATE OF ADMISSION:  12/29/2006  DATE OF DISCHARGE:                              HISTORY & PHYSICAL   CHIEF COMPLAINT:  Fatigue, sleeping a lot, dyspnea on exertion, dry  cough, weight loss and failure to thrive.   HISTORY OF PRESENT ILLNESS:  This is a 77 year old female, seen in the  office on December 29, 2006 feeling older, giving out all over,  always tired, remaining in bed, sleeping all day, gets up to take her  medicines, goes back to sleep, is tired, gives out, some aches and pains  especially where she had pleurisy.  She has got nasal congestion and  blowing her nose.  She is getting increasing dyspnea on exertion, she is  developing a dry cough and she is failing to thrive and developing  further weakness.  She will be admitted for further evaluation and  treatment.   PAST MEDICAL HISTORY:  1. Hypothyroidism.  2. Glaucoma, open angle.  3. Hyperlipidemia.  4. Kidney stones.  5. Macular degeneration.  6. Anemia with negative stool cards.  7. Osteoarthritis.  8. History of left internal capsule ischemic cerebrovascular accident      in February of 2005.  9. Aortic stenosis and aortic insufficiency.  10.Cataract x2.  11.History of benign breast biopsy.   MEDICATIONS:  Includes:  1. Synthroid 100 mcg per day.  2. Lumigan drops 1 drop both bilateral q.h.s.  3. Timolol 1 drop bilateral q.h.s.  4. Enteric-coated aspirin 325 daily.  5. Vitamin D 50,000 units weekly.  6. Simvastatin 20 mg p.o. daily.   SOCIAL HISTORY:  She lives by herself.  She has some help at home.  She  is widowed, 5 children, 7 grandchildren and 4 great grandchildren.  She  does not smoke.   FAMILY HISTORY:  Father died at the age of 52 of an accident.   Mother  died at the age of 45 of stroke and had diabetes.  Siblings have  diabetes and coronary disease.   ALLERGIES:  INCLUDES:  1. PENICILLIN.  2. SEPTRA.  3. FLU SHOT.   REVIEW OF SYSTEMS:  She denies any fevers, chills or night sweats.  She  has had a 20 pound weight loss in the last year.  She wears glasses, but  has no other ears, nose or throat issues.  She had no skin issues  currently.  She denies any chest pain or shortness of breath, but does  have dyspnea on exertion when she walks.  No orthopnea.  No PND.  She  has got dependent edema at bedtime.  She has got a cough without  wheezing.  She denies any urinary symptoms.  She denies any numbness,  but is weak.  She does not claim to be anxious or depressed.  She denies  any myalgias  or arthralgias, but she does claim to be achy.  She denies  any nausea, vomiting or diarrhea, blood in her stools, reflux or  indigestion.  All other organ systems reviewed and negative.   PHYSICAL EXAMINATION:  VITAL SIGNS:  Temperature 97.3.  Heart rate  anywhere from 44 to 68.  Weight is 132, which is down 3 pounds from last  visit about 1 month ago.  Blood pressure was 104/60.  Saturating 99% on  room air.  HEENT:  PERRL.  EOMI.  Tympanic membranes are clear.  Oropharynx is  without erythema.  Mouth is dry.  NECK: Without  lymphadenopathy.  No thyromegaly.  No bruit noted.  CARDIOVASCULAR:  Regular with a 3/6 systolic ejection murmur.  LUNGS:  Clear to auscultation bilaterally.  ABDOMEN:  Soft, nontender.  She appears thinner.  EXTREMITIES:  No clubbing and no cyanosis.  Minimal edema.  NEUROLOGIC:  Alert to person, place and month, but not year.  Strength  seems appropriate and equal bilaterally and she has got 2+ DTRs.  She is  walking with a cane.  She is slow and steady by her daughter.   ASSESSMENT:  [name] is a 77 year old woman who lives by herself,  who has lost weight, who is anorexic, has weakness and failure to  thrive  in a patient with known aortic stenosis.  Her last 2D echocardiogram was  done on Sep 11, 2005, which showed an ejection fraction of 60% to 65%  with markedly reduced aortic valve leaflet excursions.  Findings were  consistent with moderate to severe aortic valve stenosis.  There was  also mild aortic valve regurgitation.  The mean valve gradient was 34.3  mmHg, an estimated aortic valve area was 99.99 cm/2.  The differential  diagnoses includes worsening aortic valve and worsening aortic stenosis,  which may now be pretty symptomatic.  She recently was here in the  office on December 07, 2006 with nausea, vomiting and diarrhea that was  consistent with a viral gastroenteritis.  Sure at that time, her viral  illness may have progressed and is causing underlying issues.  She could  be having worsening anemia because this is a known issue.  She could  have underlying cancer.  She could have worsening dementia.  Her thyroid  may not be controlled.  She may have i.e. hypothyroidism and again she  may have a virus versus some bacterial infection.  She also could have  side effects from any of her underlying medications, especially the  statin.  We will admit for further evaluation and treatment.   PLAN:  1. Again, as stated above admit for further evaluation and treatment.  2. Telemetry monitoring.  3. We will check labs, including a CMET, CBC, CK, troponin-I.  I will      also check tumor markers, CEA, CA-99, CA-125.  We will also check a      chest x-ray and EKG.  4. We will check a 2D echocardiogram with Dr. Gala Romney.  5. Discussed do not resuscitate.  She is do not resuscitated.  6. We will place on squeezers for deep venous thrombosis prophylaxis.  7. We will also potentially get a nutrition consult.  She denies any      dysphagia, so I do not see any reason for gastroenterology.  We      will also get physical therapy, occupational therapy and case      management.  She may  need a higher level of functioning.  She may      need a higher level of care than her own current situation.  8. We will followup on vitamin D level.  9. I do not believe that there has been any finer symptoms that a      stroke has happened.  10.For her glaucoma, we will keep her on drops.   I was opened and honest with the patient and told her all the  differential diagnoses and what I am looking for and what I plan on  accomplishing.  At this current time, the plan is to admit her, get  broad spectrum on labs to see if they show anything.  If not, if the  tumor markers, the x-ray and further testing may be in order.  I do  realize that she is 77 years old and may not want very much, but I think  just trying to figure out what the underlying cause of her failing is,  is the most important.  It is very possible that this is just  progressive aortic stenosis and there is probably not much we are going  to be able to do.      Gwen Pounds, MD  Electronically Signed     JMR/MEDQ  D:  12/29/2006  T:  12/30/2006  Job:  [id number]

## 2010-08-30 NOTE — H&P (Signed)
[name], [name]                               ACCOUNT NO.:  [id number]   MEDICAL RECORD NO.:  [id number]                   PATIENT TYPE:  INP   LOCATION:  0372                                 [facility]ACILITY:  [facility]   PHYSICIAN:  Rosalyn Gess. Norins, M.D. [facility]         DATE OF BIRTH:  11-05-1914   DATE OF ADMISSION:  05/19/2003  DATE OF DISCHARGE:                                HISTORY & PHYSICAL   CHIEF COMPLAINT:  New cerebrovascular accident.   HISTORY OF PRESENT ILLNESS:  [name] is a very pleasant 77 year old white  widowed female, generally in good health.  The patient fell asleep watching  television on May 18, 2003, in the afternoon.  Upon awakening at about  4:30 a.m. she noticed she has paresthesias and lost of sensation in the  right side radiating from her ear all the way down to her leg.  She reports  she was able to get up, fix her supper, and went to bed without event.  She  awoke at 0400 hours still with paresthesia.  She returned to sleep, and when  she awoke in the morning she continued to have paresthesia, more of a dull  sensation on the right side.  Because of her persistent symptoms she went to  see Dr. Roxy Manns at [facility], and was seen about  mid-day.  Because of her persistent loss of sensation she was sent for a MRI  at [facility].  This study was read out as showing a  punctate area of acute ischemic in the posterior limb of the left internal  capsule.  The patient was contacted by Dr. Milinda Antis who was told to stay with  relatives and to start on aspirin with the plan for further evaluation at  the beginning of the week.  Family was concerned about her symptoms, and  therefore she is now admitted to the hospital for telemetry observation,  EKG, evaluation for carotid artery disease, and evaluation of her heart to  rule out a cardiogenic source of embolic phenomenon.   PAST SURGICAL HISTORY:  1. Status post  cholecystectomy.  2. Benign lumpectomy from breast.  3. Status post cataract surgery with intraocular lens implants.  4. The patient is a gravida 6, para 6 with no complications.   PAST MEDICAL HISTORY:  1. She had the usual childhood diseases.  2. Glaucoma.  3. History of hypothyroid disease.   MEDICATIONS:  Synthroid, unspecified dose.  No other medications.   ALLERGIES:  1. PENICILLIN.  2. SULFA.   FAMILY HISTORY:  Noncontributory in an 77 year old.   SOCIAL HISTORY:  The patient lives alone.  She is independent in her ADLs,  including driving.  She has a very supportive family and is the mother-in-  law for Dr. Thurston Pounds.  The patient reports that she does have a living will  and does not want to have  heroic measures, including cardiac resuscitation  or mechanical ventilatory support.   REVIEW OF SYSTEMS:  The patient reports he has been feeling well and doing  well.  She denies any cardiovascular, respiratory, GI problems.   PHYSICAL EXAMINATION:  VITAL SIGNS:  Temperature 97.2, blood pressure  133/70, heart rate 72, respirations 20.  GENERAL APPEARANCE:  A well-developed, well-nourished Caucasian female who  looks younger than her stated chronologic age.  She is in no acute distress.  HEENT:  Normocephalic, atraumatic.  Conjunctivae and sclerae was clear.  Oropharynx without lesions.  NECK:  Supple.  There was a prominent, but not enlarged thyroid with no  nodules.  NODES:  No adenopathy was noted in the cervical, supraclavicular regions.  CHEST:  The patient is moving air well.  Lungs are clear to auscultation and  percussion without wheezes, rhonchi, or rales.  She had no CVA tenderness.  BREASTS:  Deferred.  CARDIOVASCULAR:  2+ radial pulse, 2+ dorsalis pedis pulse.  She had a quiet  precordium.  She had a regular rate with frequent pauses.  She had a 3/6  systolic murmur that was harsh, heard best at the right sternal border.  A  2/6 systolic murmur at the left  sternal border.  A 2/6 systolic murmur at  the axilla.  She had no JVD.  Question of carotid bruits, right greater than  left versus radiation of cardiac murmurs.  ABDOMEN:  Soft with positive bowel sounds in all four quadrants.  No  guarding or rebound, no organosplenomegaly was noted.  PELVIC:  Deferred.  RECTAL:  Deferred.  NEUROLOGIC:  The patient is awake and alert.  She is oriented to person,  place, time, and context.  She is a good historian.  Her speech is clear.  There is no evidence of cognitive decline.  She is able to follow three step  commands without difficulty.  Motor strength was 5/5 and equal throughout.  DTRs were 2+ and symmetrical except 3+ at the right patellar tendon.  Sensation revealed decreased sensation to light touch and pinprick at the  right upper extremity and right lower extremity, both lateral and medial  aspects.  Cerebellar function:  No tremors.  Negative Romberg.  She had no  pronator drift.  She had normal gait, but did poorly with tandem gait.  Cranial nerves II-XII were grossly intact with normal facial symmetry and  muscle movement.  Extraocular movements were intact.  Pupils were irregular,  status post cataract surgery with intraocular lens implants.  No deviation  of the tongue or uvula.   LABORATORY DATA:  EKG revealed a normal sinus rhythm with frequent pauses.  CBC revealed a white count of 6400 with normal differential.  Hemoglobin was  11.8 g, hematocrit 35.1%, platelet count 245,000.  Multi-chemistry revealed  a sodium of 140, potassium 4.1, chloride 107, CO2 26, BUN 26, creatinine  1.1, glucose 115.  Liver functions were normal.  MRI from [facility] as noted.   ASSESSMENT AND PLAN:  1. Neurologic.  The patient with decreased sensation, right lower and upper     extremities, with mild ataxia.  She has well preserved motor strength.    This is consistent with a left middle cerebral artery lacunar type event.     The  patient has multiple cardiac murmurs and a question of carotid     disease versus radiated murmurs.  The patient also has frequent ectopy.     The patient does need to be ruled  out for a cardiac origin of embolic     phenomenon or peripheral vascular disease with embolic phenomenon.  Plan     is for telemetry observation for any arrhythmia.  We will request over     the weekend a 2-D echocardiogram and MRA.  We will continue the patient     on aspirin started earlier today, and we will add Plavix 75 mg daily.     The patient is very functional.  There is no indication or need for     occupational therapy or physical therapy evaluation.  2. Hypothyroid disease.  The patient will continue her home dose medication     when the family brings that information to me.  3. Terminal care issues.  The patient quite clearly states in the presence     of her daughter that she would not want     heroic measures to prolong her life;  specifically, cardiac or     respiratory resuscitation or mechanical ventilatory support.  Therefore,     the patient will be made a limited code with the use of antiarrhythmic     agents, pressor agents, central venous access if needed.                                               Rosalyn Gess Norins, M.D. [facility]    MEN/MEDQ  D:  05/20/2003  T:  05/20/2003  Job:  [id number]   cc:   Marne A. Milinda Antis, M.D. [facility]

## 2010-08-30 NOTE — Discharge Summary (Signed)
NAMEKRIS, [name]                               ACCOUNT NO.:  [id number]   MEDICAL RECORD NO.:  [id number]                   PATIENT TYPE:  INP   LOCATION:  0372                                 FACILITY:  [facility]   PHYSICIAN:  Rosalyn Gess. Norins, M.D. [facility]         DATE OF BIRTH:  [birth year]   DATE OF ADMISSION:  05/19/2003  DATE OF DISCHARGE:                                 DISCHARGE SUMMARY   ADMISSION DIAGNOSIS:  Cerebrovascular accident.   DISCHARGE DIAGNOSIS:  Cerebrovascular accident with symptoms resolved.   HISTORY OF PRESENT ILLNESS:  The patient is a very pleasant 77 year old  woman who had the onset of right sided paresthesia and numbness on May 18, 2003. She was seen and evaluated by [facility], M.D.  at the [facility] on Friday, May 19, 2003 midday.  MRI  scan was performed that same afternoon at [facility] which  revealed punctate area of acute ischemia in the posterior limb of the left  internal capsule.  The patient was brought to the hospital for admission and  further evaluation to make sure there was no arrhythmogenic or vascular  cause for this mild stroke.   Please see the admission not for past medical history, family history,  social history and general physical exam.  Her admitting exam was  significant for a diminished sensation on the right upper and lower  extremities particularly at the medial aspect of the right lower leg without  any other abnormalities.   HOSPITAL COURSE:  #1.  NEUROLOGIC:  The patient was admitted to the  hospital.  She did have a 12 lead electrocardiogram which revealed a normal  sinus rhythm with frequent premature beats.  She did remain very stable  during her hospitalization and actually had decreased amounts of paresthesia  in her right leg.  The patient was sent for MRA that was performed on the  day of discharge at [facility].  This revealed that the patient did  have __________ artery dominant with a diminutive right vertebral artery.  There was irregularity of the proximal internal carotid artery bilaterally  without evidence of hemodynamically significant stenosis.  There was right  greater than left prominent narrowing proximal external carotid artery. Left  greater than right narrowing of the proximal common carotid artery.  There  are no other abnormalities noted.  The patient was to have a 2-D echo but  this will not be able to be performed until at least Monday and given that  the patient has been stable, that she is having no further symptoms and that  her telemetry monitoring was normal, I believe this can be pursued as an  outpatient.  With the patient being stable having been started on Plavix in  addition to aspirin, at this time she is ready for discharge home.  #2.  HYPOTHYROID DISEASE STABLE.  The  patient will continue on her home dose  of medication.   DISCHARGE EXAMINATION:  VITAL SIGNS:  The patient's afebrile, vital signs  were stable.  GENERAL:  The patient's up sitting on the side of the bed eating her supper.  She is awake, alert with no cognitive abnormalities or changes.  NEUROLOGIC:  Basically unchanged with no significant abnormalities.   DISPOSITION:  The patient is to be discharged home.  She is to continue on  aspirin and Plavix 75 mg q.d., prescription is called in to [facility].  The patient should contact Dr. Royden Purl office on Monday in  regards  to scheduling for 2-D echo to complete her evaluation.  The patient should  also schedule appointment to followup with Dr. Milinda Antis in 2-3 weeks or sooner  on an as needed basis.   The patient's condition at the time of discharge dictation is stable.                                               Rosalyn Gess Norins, M.D. [facility]    MEN/MEDQ  D:  05/21/2003  T:  05/21/2003  Job:  [id number]   cc:   Marne A. Milinda Antis, M.D. [facility]   Mark A. Waynard Edwards, M.D.  [address]   [place]  [place] [address]  Fax: [phone]

## 2011-01-23 LAB — COMPREHENSIVE METABOLIC PANEL
ALT: 22
BUN: 18
Calcium: 8.7
Creatinine, Ser: 1.15
GFR calc non Af Amer: 44 — ABNORMAL LOW
Glucose, Bld: 119 — ABNORMAL HIGH
Sodium: 136
Total Protein: 7.5

## 2011-01-23 LAB — URINALYSIS, ROUTINE W REFLEX MICROSCOPIC
Nitrite: NEGATIVE
Protein, ur: NEGATIVE
Specific Gravity, Urine: 1.008
Urobilinogen, UA: 0.2

## 2011-01-23 LAB — CARDIAC PANEL(CRET KIN+CKTOT+MB+TROPI)
CK, MB: 1.5
Troponin I: 0.02

## 2011-01-23 LAB — CBC
HCT: 27.5 — ABNORMAL LOW
HCT: 31.2 — ABNORMAL LOW
Hemoglobin: 9.2 — ABNORMAL LOW
Hemoglobin: 9.5 — ABNORMAL LOW
MCHC: 33.8
MCV: 86.8
Platelets: 304
Platelets: 356
RBC: 3.26 — ABNORMAL LOW
RDW: 13.1
WBC: 6.8

## 2011-01-23 LAB — BASIC METABOLIC PANEL
Calcium: 8.4
Creatinine, Ser: 0.88
GFR calc Af Amer: >60
GFR calc non Af Amer: >60
Sodium: 135

## 2011-01-23 LAB — CANCER ANTIGEN 19-9: CA 19-9: 26.6 — ABNORMAL LOW

## 2011-01-23 LAB — B-NATRIURETIC PEPTIDE (CONVERTED LAB): Pro B Natriuretic peptide (BNP): 386 — ABNORMAL HIGH

## 2011-01-23 LAB — IRON AND TIBC
Iron: 32 — ABNORMAL LOW
TIBC: 216 — ABNORMAL LOW
UIBC: 184

## 2011-01-23 LAB — CA 125: CA 125: 15.6

## 2011-01-23 LAB — FERRITIN: Ferritin: 458 — ABNORMAL HIGH (ref 10–291)

## 2011-01-23 LAB — VITAMIN B12
Vitamin B-12: 445 (ref 211–911)
Vitamin B-12: 636 (ref 211–911)

## 2011-01-23 LAB — FOLATE RBC: RBC Folate: 1153 — ABNORMAL HIGH

## 2011-01-23 LAB — CEA: CEA: 1.9

## 2011-01-23 LAB — TSH: TSH: 0.429

## 2011-07-25 ENCOUNTER — Emergency Department: Payer: Self-pay | Admitting: *Deleted

## 2011-07-25 LAB — COMPREHENSIVE METABOLIC PANEL
Anion Gap: 15 (ref 7–16)
BUN: 34 mg/dL — ABNORMAL HIGH (ref 7–18)
Calcium, Total: 8.5 mg/dL (ref 8.5–10.1)
Creatinine: 1.38 mg/dL — ABNORMAL HIGH (ref 0.60–1.30)
EGFR (Non-African Amer.): 33 — ABNORMAL LOW
Glucose: 90 mg/dL (ref 65–99)
Osmolality: 281 (ref 275–301)
Potassium: 3.9 mmol/L (ref 3.5–5.1)
SGOT(AST): 31 U/L (ref 15–37)
Sodium: 137 mmol/L (ref 136–145)
Total Protein: 8.1 g/dL (ref 6.4–8.2)

## 2011-07-25 LAB — TROPONIN I: Troponin-I: 0.02 ng/mL

## 2011-07-25 LAB — CBC
HCT: 37.5 % (ref 35.0–47.0)
MCH: 30.3 pg (ref 26.0–34.0)
MCHC: 33.4 g/dL (ref 32.0–36.0)
Platelet: 231 10*3/uL (ref 150–440)
RDW: 13.2 % (ref 11.5–14.5)
WBC: 8 10*3/uL (ref 3.6–11.0)

## 2012-03-21 IMAGING — CR DG CHEST 1V PORT
1 series · 1 of 1 positions shown · non-contrast
Comparison: none

REASON FOR EXAM: cough
COMMENTS:   LMP: Post-Menopausal

PROCEDURE:     DXR - DXR PORTABLE CHEST SINGLE VIEW  - May 07, 2010  [DATE]
RESULT:     Cardiomegaly with pulmonary vascular prominence and interstitial
prominence is noted suggesting mild congestive heart failure. Mild pneumonia
in the lung bases particularly the left cannot be excluded.

[view not recorded]
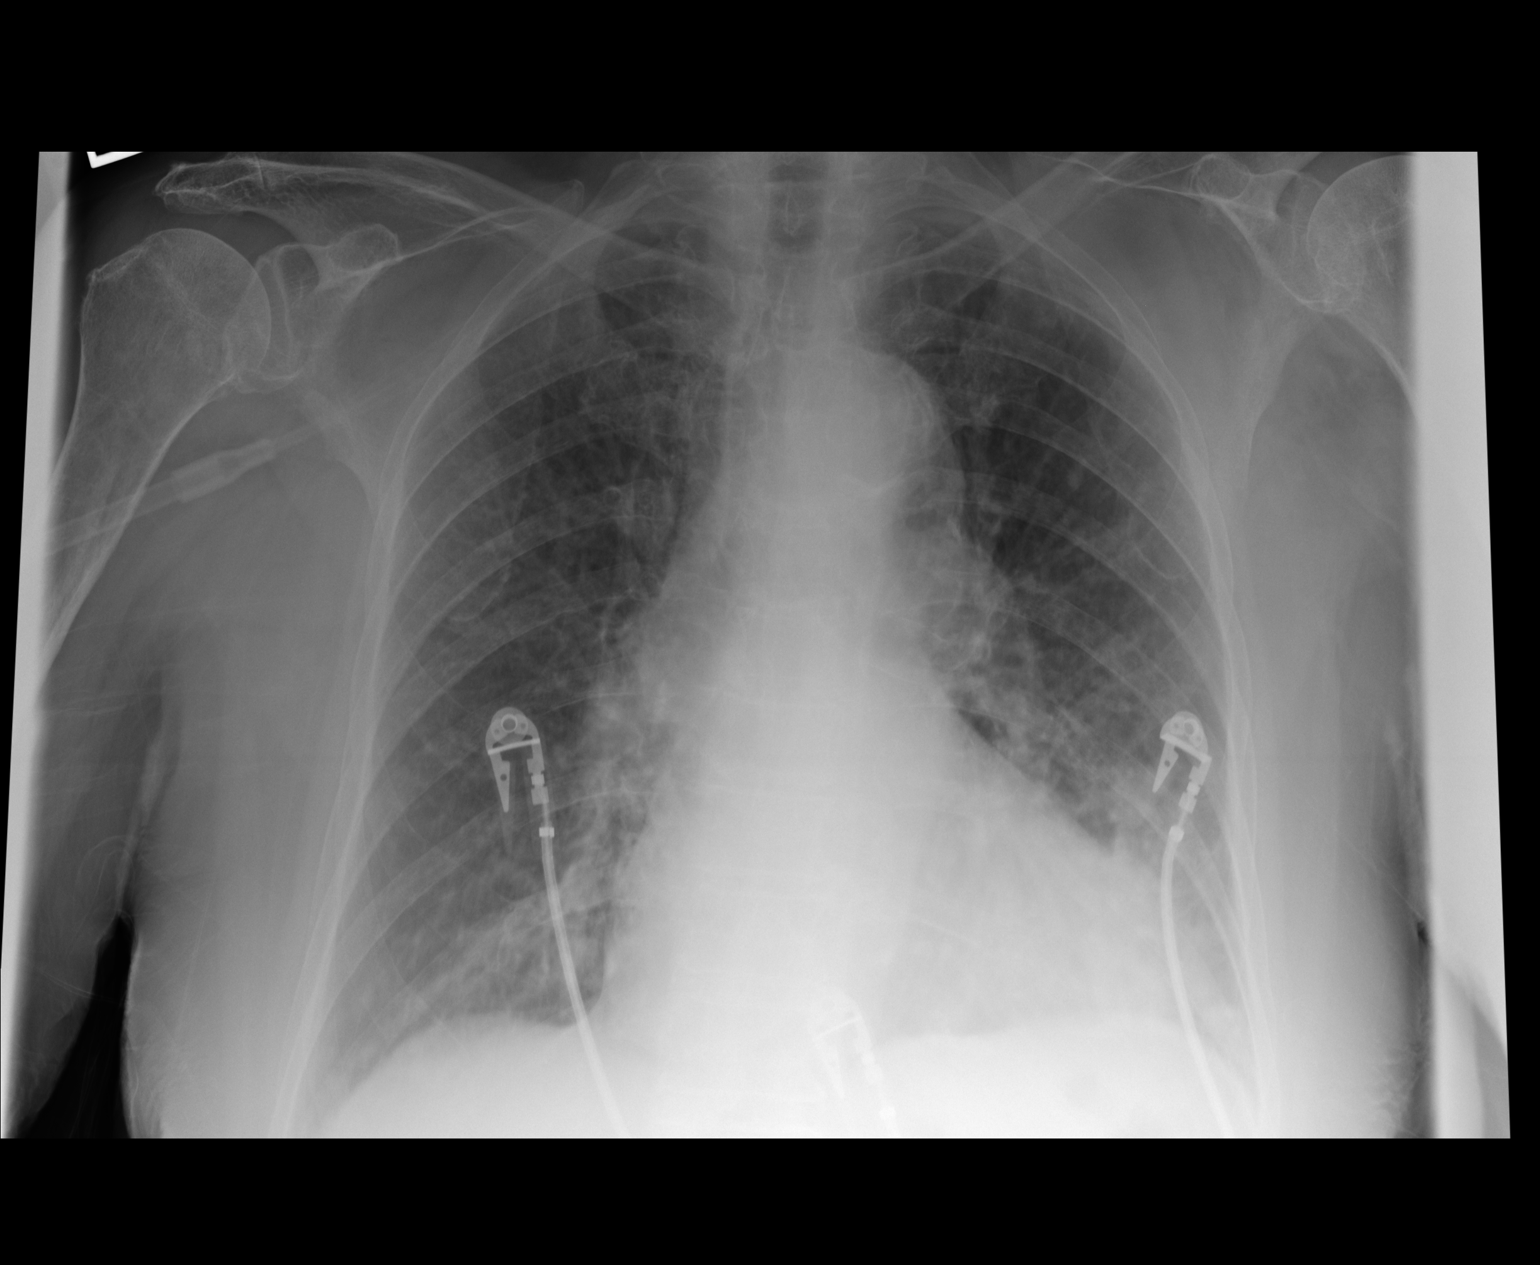

[1 of 1 positions shown; findings below may reference images not displayed]

IMPRESSION: Cannot exclude mild congestive heart failure and pulmonary edema and/or
bibasilar pneumonia.

## 2012-03-23 IMAGING — CR DG CHEST 1V PORT
1 series · 1 of 1 positions shown · non-contrast
Comparison: Portable exam 9891 hours compared to 05/08/2010

CLINICAL DATA: Pneumonia

PORTABLE CHEST - 1 VIEW

[AP]
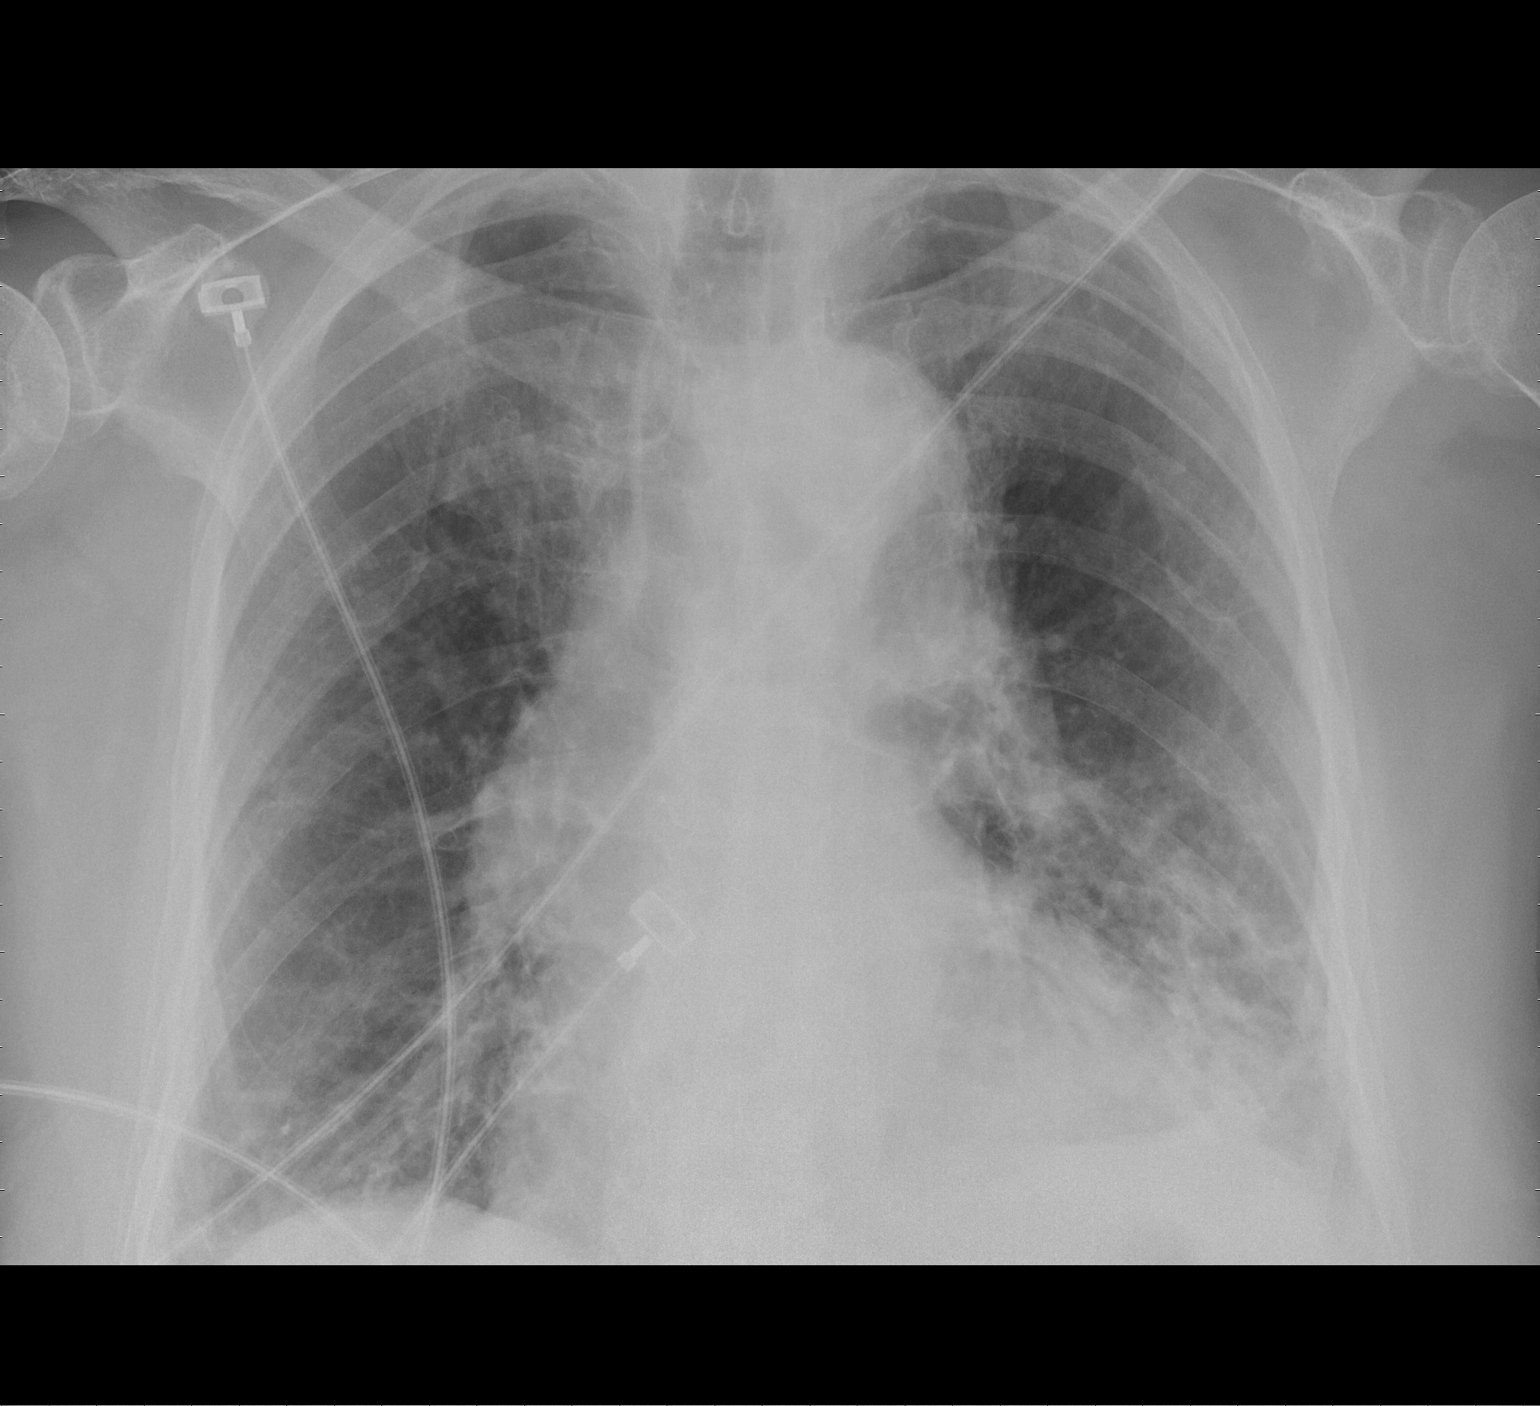

[1 of 1 positions shown; findings below may reference images not displayed]

FINDINGS: Enlargement of cardiac silhouette.
Calcified tortuous aorta.
Persistent left lower lobe consolidation.
Minimal atelectasis at right lung base.
Upper lungs clear.
Bones demineralized.
IMPRESSION: Persistent left lower lobe consolidation.
Right basilar atelectasis.

## 2012-05-13 ENCOUNTER — Emergency Department: Payer: Self-pay | Admitting: Emergency Medicine

## 2012-05-13 ENCOUNTER — Encounter (HOSPITAL_COMMUNITY): Payer: Self-pay | Admitting: Physician Assistant

## 2012-05-13 ENCOUNTER — Inpatient Hospital Stay (HOSPITAL_COMMUNITY): Payer: Medicare Other

## 2012-05-13 ENCOUNTER — Inpatient Hospital Stay (HOSPITAL_COMMUNITY)
Admission: RE | Admit: 2012-05-13 | Discharge: 2012-05-21 | DRG: 469 | Disposition: A | Discharge status: Skilled Nursing Facility | Payer: Medicare Other | Source: Other Acute Inpatient Hospital | Attending: Internal Medicine | Admitting: Internal Medicine

## 2012-05-13 DIAGNOSIS — Z7982 Long term (current) use of aspirin: Secondary | ICD-10-CM

## 2012-05-13 DIAGNOSIS — D62 Acute posthemorrhagic anemia: Secondary | ICD-10-CM | POA: Diagnosis not present

## 2012-05-13 DIAGNOSIS — H3530 Unspecified macular degeneration: Secondary | ICD-10-CM | POA: Diagnosis present

## 2012-05-13 DIAGNOSIS — S72002A Fracture of unspecified part of neck of left femur, initial encounter for closed fracture: Secondary | ICD-10-CM | POA: Diagnosis present

## 2012-05-13 DIAGNOSIS — Y9210 Unspecified residential institution as the place of occurrence of the external cause: Secondary | ICD-10-CM | POA: Diagnosis present

## 2012-05-13 DIAGNOSIS — Z79899 Other long term (current) drug therapy: Secondary | ICD-10-CM

## 2012-05-13 DIAGNOSIS — Z66 Do not resuscitate: Secondary | ICD-10-CM | POA: Diagnosis present

## 2012-05-13 DIAGNOSIS — K567 Ileus, unspecified: Secondary | ICD-10-CM

## 2012-05-13 DIAGNOSIS — Z8673 Personal history of transient ischemic attack (TIA), and cerebral infarction without residual deficits: Secondary | ICD-10-CM

## 2012-05-13 DIAGNOSIS — N390 Urinary tract infection, site not specified: Secondary | ICD-10-CM | POA: Diagnosis not present

## 2012-05-13 DIAGNOSIS — F0390 Unspecified dementia without behavioral disturbance: Secondary | ICD-10-CM | POA: Diagnosis present

## 2012-05-13 DIAGNOSIS — I350 Nonrheumatic aortic (valve) stenosis: Secondary | ICD-10-CM | POA: Diagnosis present

## 2012-05-13 DIAGNOSIS — Z888 Allergy status to other drugs, medicaments and biological substances status: Secondary | ICD-10-CM

## 2012-05-13 DIAGNOSIS — K560 Paralytic ileus: Secondary | ICD-10-CM | POA: Diagnosis not present

## 2012-05-13 DIAGNOSIS — I359 Nonrheumatic aortic valve disorder, unspecified: Secondary | ICD-10-CM | POA: Diagnosis present

## 2012-05-13 DIAGNOSIS — A498 Other bacterial infections of unspecified site: Secondary | ICD-10-CM | POA: Diagnosis not present

## 2012-05-13 DIAGNOSIS — H4010X0 Unspecified open-angle glaucoma, stage unspecified: Secondary | ICD-10-CM | POA: Diagnosis present

## 2012-05-13 DIAGNOSIS — E860 Dehydration: Secondary | ICD-10-CM | POA: Diagnosis present

## 2012-05-13 DIAGNOSIS — Z880 Allergy status to penicillin: Secondary | ICD-10-CM

## 2012-05-13 DIAGNOSIS — G9340 Encephalopathy, unspecified: Secondary | ICD-10-CM | POA: Diagnosis not present

## 2012-05-13 DIAGNOSIS — H409 Unspecified glaucoma: Secondary | ICD-10-CM | POA: Diagnosis present

## 2012-05-13 DIAGNOSIS — S72009A Fracture of unspecified part of neck of unspecified femur, initial encounter for closed fracture: Principal | ICD-10-CM | POA: Diagnosis present

## 2012-05-13 DIAGNOSIS — E039 Hypothyroidism, unspecified: Secondary | ICD-10-CM | POA: Diagnosis present

## 2012-05-13 DIAGNOSIS — Z887 Allergy status to serum and vaccine status: Secondary | ICD-10-CM

## 2012-05-13 DIAGNOSIS — Z882 Allergy status to sulfonamides status: Secondary | ICD-10-CM

## 2012-05-13 DIAGNOSIS — E785 Hyperlipidemia, unspecified: Secondary | ICD-10-CM | POA: Diagnosis present

## 2012-05-13 DIAGNOSIS — M1990 Unspecified osteoarthritis, unspecified site: Secondary | ICD-10-CM | POA: Diagnosis present

## 2012-05-13 DIAGNOSIS — D649 Anemia, unspecified: Secondary | ICD-10-CM | POA: Diagnosis not present

## 2012-05-13 DIAGNOSIS — N209 Urinary calculus, unspecified: Secondary | ICD-10-CM | POA: Diagnosis present

## 2012-05-13 DIAGNOSIS — W19XXXA Unspecified fall, initial encounter: Secondary | ICD-10-CM | POA: Diagnosis present

## 2012-05-13 HISTORY — DX: Hypothyroidism, unspecified: E03.9

## 2012-05-13 HISTORY — DX: Nonrheumatic aortic (valve) stenosis: I35.0

## 2012-05-13 HISTORY — DX: Calculus of kidney: N20.0

## 2012-05-13 HISTORY — DX: Unspecified dementia, unspecified severity, without behavioral disturbance, psychotic disturbance, mood disturbance, and anxiety: F03.90

## 2012-05-13 HISTORY — DX: Primary open-angle glaucoma, unspecified eye, stage unspecified: H40.1190

## 2012-05-13 HISTORY — DX: Unspecified osteoarthritis, unspecified site: M19.90

## 2012-05-13 HISTORY — DX: Cerebral infarction, unspecified: I63.9

## 2012-05-13 HISTORY — DX: Unspecified macular degeneration: H35.30

## 2012-05-13 HISTORY — DX: Hyperlipidemia, unspecified: E78.5

## 2012-05-13 LAB — CREATININE, SERUM
Creatinine, Ser: 1.22 mg/dL — ABNORMAL HIGH (ref 0.50–1.10)
GFR calc Af Amer: 42 mL/min — ABNORMAL LOW (ref >90)
GFR calc non Af Amer: 36 mL/min — ABNORMAL LOW (ref >90)

## 2012-05-13 LAB — COMPREHENSIVE METABOLIC PANEL
Albumin: 3.7 g/dL (ref 3.4–5.0)
Albumin: 3.5 g/dL (ref 3.5–5.2)
BUN: 19 mg/dL — ABNORMAL HIGH (ref 7–18)
BUN: 17 mg/dL (ref 6–23)
Calcium: 9.4 mg/dL (ref 8.4–10.5)
Chloride: 102 mmol/L (ref 98–107)
Co2: 26 mmol/L (ref 21–32)
Creatinine, Ser: 1.25 mg/dL — ABNORMAL HIGH (ref 0.50–1.10)
EGFR (African American): 42 — ABNORMAL LOW
GFR calc Af Amer: 41 mL/min — ABNORMAL LOW (ref >90)
Glucose, Bld: 111 mg/dL — ABNORMAL HIGH (ref 70–99)
Osmolality: 277 (ref 275–301)
Potassium: 5 mEq/L (ref 3.5–5.1)
SGOT(AST): 19 U/L (ref 15–37)
Sodium: 138 mmol/L (ref 136–145)
Total Protein: 7.8 g/dL (ref 6.4–8.2)
Total Protein: 7.2 g/dL (ref 6.0–8.3)

## 2012-05-13 LAB — URINALYSIS, ROUTINE W REFLEX MICROSCOPIC
Bilirubin Urine: NEGATIVE
Glucose, UA: NEGATIVE mg/dL
Specific Gravity, Urine: 1.017 (ref 1.005–1.030)
pH: 6.5 (ref 5.0–8.0)

## 2012-05-13 LAB — CBC WITH DIFFERENTIAL/PLATELET
Basophil %: 1.3 %
Eosinophil #: 0.3 10*3/uL (ref 0.0–0.7)
HCT: 35.8 % (ref 35.0–47.0)
HGB: 11.6 g/dL — ABNORMAL LOW (ref 12.0–16.0)
Lymphocyte #: 2 10*3/uL (ref 1.0–3.6)
MCH: 29.1 pg (ref 26.0–34.0)
Monocyte %: 7.5 %
Neutrophil #: 3.8 10*3/uL (ref 1.4–6.5)
Platelet: 197 10*3/uL (ref 150–440)
RDW: 13 % (ref 11.5–14.5)
WBC: 6.7 10*3/uL (ref 3.6–11.0)

## 2012-05-13 LAB — CBC
HCT: 37.8 % (ref 36.0–46.0)
Hemoglobin: 12.6 g/dL (ref 12.0–15.0)
MCH: 30.3 pg (ref 26.0–34.0)
MCHC: 33.3 g/dL (ref 30.0–36.0)
MCV: 91.3 fL (ref 78.0–100.0)
Platelets: 190 10*3/uL (ref 150–400)
RBC: 4.14 MIL/uL (ref 3.87–5.11)
RDW: 13.2 % (ref 11.5–15.5)
WBC: 10.7 10*3/uL — ABNORMAL HIGH (ref 4.0–10.5)

## 2012-05-13 LAB — URINALYSIS, COMPLETE
Bacteria: NONE SEEN
Bilirubin,UR: NEGATIVE
Leukocyte Esterase: NEGATIVE
Ph: 8 (ref 4.5–8.0)
RBC,UR: 1 /HPF (ref 0–5)
Specific Gravity: 1.008 (ref 1.003–1.030)
Squamous Epithelial: <1
WBC UR: <1 /HPF (ref 0–5)

## 2012-05-13 LAB — MRSA PCR SCREENING: MRSA by PCR: NEGATIVE

## 2012-05-13 LAB — PROTIME-INR: Prothrombin Time: 13.7 seconds (ref 11.6–15.2)

## 2012-05-13 MED ORDER — HEPARIN SODIUM (PORCINE) 5000 UNIT/ML IJ SOLN
5000.0000 [IU] | Freq: Three times a day (TID) | INTRAMUSCULAR | Status: DC
  Filled 2012-05-13 (×3): qty 1

## 2012-05-13 MED ORDER — WHITE PETROLATUM GEL
Status: AC
  Administered 2012-05-13: 22:00:00
  Filled 2012-05-13: qty 5

## 2012-05-13 MED ORDER — SODIUM CHLORIDE 0.9 % IV SOLN: INTRAVENOUS | Status: DC

## 2012-05-13 MED ORDER — ACETAMINOPHEN 650 MG RE SUPP: 650.0000 mg | RECTAL | Status: DC | PRN

## 2012-05-13 MED ORDER — TIMOLOL MALEATE 0.5 % OP SOLN
1.0000 [drp] | Freq: Every day | OPHTHALMIC | Status: DC
  Administered 2012-05-14 – 2012-05-20 (×7): 1 [drp] via OPHTHALMIC
  Filled 2012-05-13 (×2): qty 5

## 2012-05-13 MED ORDER — CALCITONIN (SALMON) 200 UNIT/ACT NA SOLN
1.0000 | Freq: Every day | NASAL | Status: DC
  Administered 2012-05-20 – 2012-05-21 (×2): 1 via NASAL
  Filled 2012-05-13 (×2): qty 3.7

## 2012-05-13 MED ORDER — MORPHINE SULFATE 2 MG/ML IJ SOLN
2.0000 mg | INTRAMUSCULAR | Status: DC | PRN
  Administered 2012-05-13 – 2012-05-14 (×2): 2 mg via INTRAVENOUS
  Filled 2012-05-13 (×2): qty 1

## 2012-05-13 MED ORDER — TRAVOPROST (BAK FREE) 0.004 % OP SOLN
1.0000 [drp] | Freq: Every day | OPHTHALMIC | Status: DC
  Administered 2012-05-14 – 2012-05-20 (×7): 1 [drp] via OPHTHALMIC
  Filled 2012-05-13 (×2): qty 2.5

## 2012-05-13 MED ORDER — LEVOTHYROXINE SODIUM 100 MCG IV SOLR
50.0000 ug | Freq: Every day | INTRAVENOUS | Status: DC
  Administered 2012-05-14: 50 ug via INTRAVENOUS
  Filled 2012-05-13 (×3): qty 2.5

## 2012-05-13 NOTE — Consult Note (Signed)
CARDIOLOGY CONSULT NOTE   Patient ID: KIMIMILA TAUZIN MRN: [id number] DOB/AGE: Sep 20, 1914 79 y.o.  Admit date: 05/13/2012  Primary Physician   Gwen Pounds, MD Primary Cardiologist  ? DB in 2005 or 2008  Reason for Consultation   Pre-op eval  HPI:Berenise SADHANA FRATER is a 79 y.o. female with a history of AS but not CAD. She fell and has a hip fracture. The family had her transferred here to get her surgical risk assessed. Because of her AS, if she has her hip fixed, they want it done here. They are also aware that her surgical risk may be higher, but not repairing the hip would leave her non-ambulatory and that would be VERY hard for her.    Per IM HPI:  This is a 79 year old female resident of [address] of [place], ALF in [place], with known history of Hypothyroidism, Glaucoma, open angle, Hyperlipidemia, Kidney stones, Macular degeneration, Osteoarthritis, s/p left internal capsule CVA 05/2003, severe aortic stenosis (Valve area 0.72) and aortic insufficiency, s/p benign breast biopsy/lumpectomy, s/p cholecystectomy, s/p cataract surgery/intraocular lens implants,dementia with sun-downing, transferred from [facility], per family request, following a fall. Precise circumstances are unclear, but patient's daughter [name] (Tel: [phone]) was contacted at 4:00 AM on 05/13/12, by staff at ALF, to inform her that patient had fallen, while in the bathroom. She was taken to [facility], where imaging studies revealed a left femoral neck fracture.  Ms Chirco has been at the ALF for 4-5 years. She generally does well there, ambulates for meals, does her own ADLs. She does not walk around much otherwise. She has chronic DOE with increased or prolonged exertion but this has been stable recently. Now, she is somnolent due to pain meds and very hard of hearing, but when questioned, denies SOB. She states has had chest pain at some point, but cannot say anything else about it. She is not  having chest pain now. She has no history of falls and this is the first fall her daughter has known about.    Past Medical History  Diagnosis Date  . Aortic stenosis, severe   . Hypothyroid   . Macular degeneration   . Osteoarthritis (arthritis due to wear and tear of joints)   . Nephrolithiasis   . CVA (cerebral infarction) 2005    minor  . Hyperlipidemia   . Open angle primary glaucoma   . Dementia     Hx sun-downing     Past Surgical History  Procedure Date  . Cholecystectomy   . Cataract extraction   . Breast lumpectomy     Allergies  Allergen Reactions  . Influenza Vaccines     Unknown  . Penicillins     Unknown  . Seconal (Secobarbital Sodium)     Unknown  . Sulfa Antibiotics     Unknown  . Tramadol     Unknown    I have reviewed the patient's current medications    . calcitonin (salmon)  1 spray Alternating Nares Daily  . heparin subcutaneous  5,000 Units Subcutaneous Q8H  . levothyroxine  50 mcg Intravenous QAC breakfast  . timolol  1 drop Both Eyes QHS  . Travoprost (BAK Free)  1 drop Both Eyes QHS      . sodium chloride     acetaminophen, morphine injection  Prior to Admission medications   Medication Sig Start Date End Date Taking? Authorizing Provider  aspirin 325 MG tablet Take 325 mg by mouth daily.  Yes Historical Provider, MD  calcitonin, salmon, (MIACALCIN/FORTICAL) 200 UNIT/ACT nasal spray Place 1 spray into the nose daily. Alternate nostrils daily   Yes Historical Provider, MD  cholecalciferol (VITAMIN D) 1000 UNITS tablet Take 2,000 Units by mouth daily.   Yes Historical Provider, MD  ferrous sulfate 325 (65 FE) MG tablet Take 325 mg by mouth daily with breakfast.   Yes Historical Provider, MD  levothyroxine (SYNTHROID, LEVOTHROID) 112 MCG tablet Take 112 mcg by mouth at bedtime.   Yes Historical Provider, MD  Multiple Vitamin (MULTIVITAMIN WITH MINERALS) TABS Take 1 tablet by mouth daily.   Yes Historical Provider, MD  senna  (SENOKOT) 8.6 MG TABS Take 1 tablet by mouth at bedtime.   Yes Historical Provider, MD  sertraline (ZOLOFT) 25 MG tablet Take 25 mg by mouth daily.   Yes Historical Provider, MD  timolol (TIMOPTIC) 0.5 % ophthalmic solution Place 1 drop into both eyes at bedtime.   Yes Historical Provider, MD  Travoprost, BAK Free, (TRAVATAN) 0.004 % SOLN ophthalmic solution Place 1 drop into both eyes at bedtime.   Yes Historical Provider, MD     History   Social History  . Marital Status: Widowed    Spouse Name: N/A    Number of Children: N/A  . Years of Education: N/A   Occupational History  . retired    Social History Main Topics  . Smoking status: Never Smoker   . Smokeless tobacco: Not on file  . Alcohol Use: No  . Drug Use: No  . Sexually Active: Not on file   Other Topics Concern  . Not on file   Social History Narrative   Pt lives alone in ALF at [place] of [address] She is able to do ADLs, walks to dining room, uses a walker when out of her room but moves around in her room without difficulty.      ROS:  Full 14 point review of systems complete and found to be negative unless listed above.  Physical Exam: Blood pressure 128/60, pulse 78, temperature 98.7 F (37.1 C), temperature source Oral, resp. rate 18, SpO2 96.00%.  General: Well developed, well nourished, elderly female in no acute distress, sleepy but appropriate. Head: Eyes PERRLA, No xanthomas.   Normocephalic and atraumatic, oropharynx without edema or exudate. Dentition: upper plate Lungs: bilateral basilar rales, no crackles Heart: HRRR S1 S2, no rub/gallop, Heart irregular rate and rhythm with S1, S2  murmur. pulses are 2+ extrem.   Neck: No carotid bruits (+ radiation of murmur). No lymphadenopathy.  JVD at 10 cm but pt only at 18 degrees and not moved due to hip pain. Abdomen: Bowel sounds present, abdomen soft and non-tender without masses or hernias noted. Msk:  No spine or cva tenderness. No weakness, LLE joint  deformity, no joint effusions. Extremities: No clubbing or cyanosis. no edema.  Neuro: Alert and oriented X 2. No focal deficits noted. Psych:  Good affect, responds appropriately Skin: No rashes or lesions noted.  Labs:   Lab Results  Component Value Date   WBC 10.7* 05/13/2012   HGB 12.8 05/13/2012   HCT 37.8 05/13/2012   MCV 91.3 05/13/2012   PLT 190 05/13/2012    Basename 05/13/12 1507  INR 1.06     Lab 05/13/12 1507 05/13/12 1324  NA -- 137  K -- 5.0  CL -- 101  CO2 -- 27  BUN -- 17  CREATININE 1.22* --  CALCIUM -- 9.4  PROT -- 7.2  BILITOT --  0.5  ALKPHOS -- 76  ALT -- 7  AST -- 18  GLUCOSE -- 111*    Echo: 12/30/2006 SUMMARY - Overall left ventricular systolic function was normal. Left ventricular ejection fraction was estimated to be 65 %. There was no diagnostic evidence of left ventricular regional wall motion abnormalities. - Aortic valve thickness was markedly increased. There was markedly reduced aortic valve leaflet excursion. Findings were consistent with severe aortic valve stenosis. There was mild aortic valvular regurgitation. The mean transaortic valve gradient was 46 mmHg. Estimated aortic valve area (by VTI) was 0.74 cm^2. Estimated aortic valve area (by Vmax) was 0.71 cm^2.  ECG: pending    Radiology:  Dg Chest Port 1 View 05/13/2012  *RADIOLOGY REPORT*  Clinical Data: Hip fracture  PORTABLE CHEST - 1 VIEW  Comparison: 05/15/2010  Findings: Study is limited by poor inspiration.  There is central vascular congestion.  No acute infiltrate or pulmonary edema.  Mild bronchitic changes.  Atherosclerotic calcifications of thoracic aorta.  IMPRESSION: Central vascular congestion without pulmonary edema. Mild bronchitic changes.   Original Report Authenticated By: Natasha Mead, M.D.    Dg Hip Portable 1 View Left 05/13/2012  *RADIOLOGY REPORT*  Clinical Data: Question hip fracture  PORTABLE LEFT HIP - 1 VIEW  Comparison: None.  Findings: Single portable  view of the left hip submitted.  There is displaced fracture of the left femoral neck.  IMPRESSION: Displaced fracture of the left femoral neck.  This was made a call report.   Original Report Authenticated By: Natasha Mead, M.D.     ASSESSMENT AND PLAN:   The patient was seen today by Dr Antoine Poche, the patient evaluated and the data reviewed.  Pre-op eval:    Aortic stenosis:   Active Problems:  Fall  Closed left hip fracture  Hypothyroidism  Urolithiasis  History of CVA (cerebrovascular accident)  Dementia   Signed: Theodore Demark 05/13/2012, 4:17 PM Co-Sign MD  History and all data above reviewed.  Patient examined.  I agree with the findings as above. The patient does not have her hearing aids in and is on pain meds.  All responses are per her daughters. She has known aortic stenosis which was severe on echo several years ago.  However, she has not had symptoms and has not had need or desire for further cardiology follow up.  Her family reports no SOB, PND or orthopnea.  She denies chest pain, syncope or presyncope. She is now s/p a fall with a femoral neck fracture on the left.   The patient exam reveals COR:RRR, late peaking apical systolic murmur.  ,  Lungs: Decreased breath sounds  ,  Abd: Positive bowel sounds, no rebound no guarding, Ext intact pulses  .  All available labs, radiology testing, previous records reviewed. Agree with documented assessment and plan. The patient is at high risk for repair based on her age, frailty and AS.  I will review the final results of the echo but the prelim look indicates mild LV dysfunction and severe/critical AS.  We cannot mitigate that risk with any medical therapy or interventional therapy at this point.  She is at risk for cardiovascular events to include hypotension and CHF.  We will follow to assist as we can.  I did speak at length to the patients daughters. They would wish to proceed with high risk surgery as quality of life is paramount in  their minds.   I called Dr. Janee Morn to discuss.   Rollene Rotunda  5:46 PM  05/13/2012  

## 2012-05-13 NOTE — Progress Notes (Signed)
Orthopedic Tech Progress Note Patient Details:  [name] 1914-09-14 [id number] OHF applied to pt's bed. Patient ID: [name], female   DOB: 04/24/1914, 79 y.o.   MRN: [id number]   Lesle Chris 05/13/2012, 4:22 PM

## 2012-05-13 NOTE — Consult Note (Addendum)
ORTHOPAEDIC CONSULTATION  REQUESTING PHYSICIAN: Laveda Norman, MD  Chief Complaint: Left hip fracture  HPI: [name] is a 79 y.o. female who complains of  left hip pain. She is a resident in a facility and was found to have fallen today. She was initially transported [facility] and subsequently transferred to [facility] for further care. She has severe/critical aortic stenosis and has been evaluated by cardiology with the appropriate degree of risk defined and discussed with the family. She denies pain elsewhere, although her ability to communicate is extremely limited.  Past Medical History  Diagnosis Date  . Aortic stenosis, severe   . Hypothyroid   . Macular degeneration   . Osteoarthritis (arthritis due to wear and tear of joints)   . Nephrolithiasis   . CVA (cerebral infarction) 2005    minor  . Hyperlipidemia   . Open angle primary glaucoma   . Dementia     Hx sun-downing   Past Surgical History  Procedure Date  . Cholecystectomy   . Cataract extraction   . Breast lumpectomy    History   Social History  . Marital Status: Widowed    Spouse Name: N/A    Number of Children: N/A  . Years of Education: N/A   Occupational History  . retired    Social History Main Topics  . Smoking status: Never Smoker   . Smokeless tobacco: None  . Alcohol Use: No  . Drug Use: No  . Sexually Active: None   Other Topics Concern  . None   Social History Narrative   Pt lives alone in ALF at [place] [facility] [place]. She is able to do ADLs, walks to dining room, uses a walker when out of her room but moves around in her room without difficulty.   History reviewed. No pertinent family history. Allergies  Allergen Reactions  . Influenza Vaccines     Unknown  . Penicillins     Unknown  . Seconal (Secobarbital Sodium)     Unknown  . Sulfa Antibiotics     Unknown  . Tramadol     Unknown   Prior to Admission medications   Medication Sig Start Date End Date  Taking? Authorizing Provider  aspirin 325 MG tablet Take 325 mg by mouth daily.   Yes Historical Provider, MD  calcitonin, salmon, (MIACALCIN/FORTICAL) 200 UNIT/ACT nasal spray Place 1 spray into the nose daily. Alternate nostrils daily   Yes Historical Provider, MD  cholecalciferol (VITAMIN D) 1000 UNITS tablet Take 2,000 Units by mouth daily.   Yes Historical Provider, MD  ferrous sulfate 325 (65 FE) MG tablet Take 325 mg by mouth daily with breakfast.   Yes Historical Provider, MD  levothyroxine (SYNTHROID, LEVOTHROID) 112 MCG tablet Take 112 mcg by mouth at bedtime.   Yes Historical Provider, MD  Multiple Vitamin (MULTIVITAMIN WITH MINERALS) TABS Take 1 tablet by mouth daily.   Yes Historical Provider, MD  senna (SENOKOT) 8.6 MG TABS Take 1 tablet by mouth at bedtime.   Yes Historical Provider, MD  sertraline (ZOLOFT) 25 MG tablet Take 25 mg by mouth daily.   Yes Historical Provider, MD  timolol (TIMOPTIC) 0.5 % ophthalmic solution Place 1 drop into both eyes at bedtime.   Yes Historical Provider, MD  Travoprost, BAK Free, (TRAVATAN) 0.004 % SOLN ophthalmic solution Place 1 drop into both eyes at bedtime.   Yes Historical Provider, MD   Dg Chest [facility] 1 View  05/13/2012  *RADIOLOGY REPORT*  Clinical Data: Hip fracture  PORTABLE CHEST - 1 VIEW  Comparison: 05/15/2010  Findings: Study is limited by poor inspiration.  There is central vascular congestion.  No acute infiltrate or pulmonary edema.  Mild bronchitic changes.  Atherosclerotic calcifications of thoracic aorta.  IMPRESSION: Central vascular congestion without pulmonary edema. Mild bronchitic changes.   Original Report Authenticated By: Natasha Mead, M.D.    Dg Hip Portable 1 View Left  05/13/2012  *RADIOLOGY REPORT*  Clinical Data: Question hip fracture  PORTABLE LEFT HIP - 1 VIEW  Comparison: None.  Findings: Single portable view of the left hip submitted.  There is displaced fracture of the left femoral neck.  IMPRESSION: Displaced fracture  of the left femoral neck.  This was made a call report.   Original Report Authenticated By: Natasha Mead, M.D.     Positive ROS: All other systems have been reviewed and were otherwise negative with the exception of those mentioned in the HPI and as above.  Physical Exam: Vitals: Refer to EMR. Constitutional:  WD, WN, NAD HEENT:  NCAT, EOMI Neuro/Psych:  Alert & oriented to person, place, and time; appropriate mood & affect Lymphatic: No generalized UE edema or lymphadenopathy Extremities / MSK:  The extremities are normal with respect to appearance, ranges of motion, joint stability, muscle strength/tone, sensation, & perfusion except as otherwise noted:   Left knee has a mild abrasion that is superficial. She has intact light touch sensation on the plantar and dorsal aspects of the foot including the first web space and is noted to flex and extend the toes. She does not move the left lower extremity to command. She does not have a response that appears painful with palpation about the knee. There is pain with passive IR/ER of the hip.  Assessment: Displaced left femoral neck fracture, all of the femur is not available on x-ray for assessment  Plan: Long discussion with the patient's children, reviewed the assessment of very high but not prohibitive risk provided by cardiology (including death), and presented options of nonoperative versus operative treatment. I tried to convey what the patient's pain and functional limitations may be of nonoperative treatment were chosen. I also discussed the risks of impaired mobility to include pneumonias, VTE, bedsores, deconditioning, etc. I understand that this is not an easy decision for the family to make. At the present time, they will continue to deliberate but would like to proceed with planning for left hip hemiarthroplasty. The patient is presently on the add-on schedule for the operating room for tomorrow, with operative time likely in the late  afternoon. 1. No pharmacological DVT prophylaxis--SCDs only 2. Pain management 3. n.p.o. after midnight 4. Will need x-rays of entire femur preoperatively.--ordered.

## 2012-05-13 NOTE — Progress Notes (Signed)
Echocardiogram 2D Echocardiogram has been performed.  Talan Gildner 05/13/2012, 5:36 PM

## 2012-05-13 NOTE — H&P (Signed)
PCP:   Gwen Pounds, MD   Chief Complaint:  Fall and left hip fracture.  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 Precise circumstances are unclear, but patient's daughter [name] (Tel: [phone]) was contacted at 4:00 AM on 05/13/12, by staff at ALF, to inform her that patient had fallen, while in the bathroom. She was taken to [facility], where imaging studies revealed a left femoral neck fracture.   Allergies:  Allergies not on file   No past medical history on file.  No past surgical history on file.  Prior to Admission medications   Not on File    Social History: Patient does not have a smoking history on file. She does not have any smokeless tobacco history on file. Her alcohol and drug histories not on file. She is widowed, has several offspring and grandchildren.  Family History:  Father died at the age of 60 of an accident. Mother. died at the age of 13 of stroke and had diabetes.   Review of Systems:  As per HPI and chief complaint. Unable to obtain from patient as she appear quite weak and uncommunicative.   Physical Exam: General:  Patient is not communicative, inconsistently follows simple instructions, not short of breath at rest.  HEENT:  Mild clinical pallor, no jaundice, no conjunctival injection or discharge. Visible buccal mucosa appears "dry".   NECK:  Supple, JVP not seen, no carotid bruits, no palpable lymphadenopathy, no palpable goiter. CHEST:  Clinically  clear to auscultation, no wheezes, no crackles. HEART:  Sounds 1 and 2 heard, normal, regular, no murmurs. ABDOMEN:  Full, soft, non-tender, no palpable organomegaly, no palpable masses, normal bowel sounds. GENITALIA:  Not examined. LOWER EXTREMITIES:  No pitting edema, palpable peripheral pulses. MUSCULOSKELETAL SYSTEM:  Patient not moving left leg/hip, secondary to pain. Generalized osteoarthritic changes. CENTRAL NERVOUS SYSTEM:  No focal neurologic deficit on gross examination.  Labs on Admission:  No results found for this or any previous visit (from the past 48 hour(s)).  Radiological Exams on Admission: No results found.  Assessment/Plan Active Problems:    1. Fall: Patient presented follow what appears to have been a mechanical fall, although precise circumstances are unclear at this time. This is complicated by a left hip fracture. See below.  2. Closed left hip fracture: This is secondary to #1. Patient was ambulant prior to fall, but is clearly at very high risk for surgical intervention, given known history of severe aortic stenosis. Will manage with analgesics, and will consult orthopedics, but recommend cardiology opinion, prior to any invasive intervention, if contemplated. 3. Aortic stenosis: Patient has known severe aortic stenosis, will valvular area of 0.72, when evaluated by Dr Arvilla Meres in 2012. We have consulted [facility]ology, to provide and opinion as to risk of possible surgical intervention.  4. Dehydration: Patient appears clinically mildly dehydrated, although lab work is not available at this time. Will commence gentle iv fluids, at 40 cc/hr, keeping in mind patient's known valvular disease.  5. Hypothyroidism: Will continue thyroxine replacement therapy, when dose known. 6. Urolithiasis: Per history. This does not appear problematic at this time. Will check  urinalysis, to rule UTI.  7. History of CVA (cerebrovascular accident): Unable to determine  extent of residual disability, due to lack of cooperation, with physical exam.  8. Dementia: Known dementia, with episodes of sun-downing. Will maintain fall precautions.   Further management will depend on clinical course.   Comment: Patient is DNR/DNI.   Note: Have discussed with daughter, [name], (Tel: [phone]) via phone and updated her full as to care plan. She understands that surgical intervention may be impracticable, but will await opinion/consensus, of cardiologist and orthopedic surgeon.    Time Spent on Admission: 1 hour.   Dejan Angert,CHRISTOPHER 05/13/2012, 12:38 PM

## 2012-05-14 ENCOUNTER — Inpatient Hospital Stay (HOSPITAL_COMMUNITY): Payer: Medicare Other

## 2012-05-14 ENCOUNTER — Encounter (HOSPITAL_COMMUNITY): Payer: Self-pay | Admitting: Anesthesiology

## 2012-05-14 ENCOUNTER — Encounter (HOSPITAL_COMMUNITY)
Admission: RE | Disposition: A | Discharge status: Skilled Nursing Facility | Payer: Self-pay | Source: Other Acute Inpatient Hospital | Attending: Internal Medicine

## 2012-05-14 ENCOUNTER — Inpatient Hospital Stay (HOSPITAL_COMMUNITY): Payer: Medicare Other | Admitting: Anesthesiology

## 2012-05-14 HISTORY — PX: HIP ARTHROPLASTY: SHX981

## 2012-05-14 LAB — BASIC METABOLIC PANEL
BUN: 22 mg/dL (ref 6–23)
Calcium: 8.6 mg/dL (ref 8.4–10.5)
Creatinine, Ser: 1.39 mg/dL — ABNORMAL HIGH (ref 0.50–1.10)
GFR calc Af Amer: 36 mL/min — ABNORMAL LOW (ref >90)
GFR calc non Af Amer: 31 mL/min — ABNORMAL LOW (ref >90)
Glucose, Bld: 97 mg/dL (ref 70–99)

## 2012-05-14 LAB — URINE CULTURE

## 2012-05-14 LAB — CBC
HCT: 34.7 % — ABNORMAL LOW (ref 36.0–46.0)
Hemoglobin: 11.8 g/dL — ABNORMAL LOW (ref 12.0–15.0)
MCH: 31.5 pg (ref 26.0–34.0)
MCHC: 34 g/dL (ref 30.0–36.0)
MCV: 92.5 fL (ref 78.0–100.0)
RDW: 13.4 % (ref 11.5–15.5)

## 2012-05-14 SURGERY — HEMIARTHROPLASTY, HIP, DIRECT ANTERIOR APPROACH, FOR FRACTURE
Anesthesia: General | Site: Hip | Laterality: Left | Wound class: Clean

## 2012-05-14 MED ORDER — VITAMIN D3 25 MCG (1000 UNIT) PO TABS
2000.0000 [IU] | ORAL_TABLET | Freq: Every day | ORAL | Status: DC
  Administered 2012-05-15 – 2012-05-21 (×7): 2000 [IU] via ORAL
  Filled 2012-05-14 (×9): qty 2

## 2012-05-14 MED ORDER — ASPIRIN EC 325 MG PO TBEC
325.0000 mg | DELAYED_RELEASE_TABLET | Freq: Every day | ORAL | Status: DC
  Administered 2012-05-15 – 2012-05-21 (×7): 325 mg via ORAL
  Filled 2012-05-14 (×8): qty 1

## 2012-05-14 MED ORDER — ONDANSETRON HCL 4 MG PO TABS: 4.0000 mg | ORAL_TABLET | Freq: Four times a day (QID) | ORAL | Status: DC | PRN

## 2012-05-14 MED ORDER — CLINDAMYCIN PHOSPHATE 600 MG/50ML IV SOLN
600.0000 mg | Freq: Four times a day (QID) | INTRAVENOUS | Status: AC
  Administered 2012-05-14 – 2012-05-15 (×2): 600 mg via INTRAVENOUS
  Filled 2012-05-14 (×2): qty 50

## 2012-05-14 MED ORDER — MENTHOL 3 MG MT LOZG: 1.0000 | LOZENGE | OROMUCOSAL | Status: DC | PRN

## 2012-05-14 MED ORDER — PROPOFOL 10 MG/ML IV BOLUS
INTRAVENOUS | Status: DC | PRN
  Administered 2012-05-14: 100 mg via INTRAVENOUS

## 2012-05-14 MED ORDER — METOCLOPRAMIDE HCL 5 MG/ML IJ SOLN: 5.0000 mg | Freq: Three times a day (TID) | INTRAMUSCULAR | Status: DC | PRN

## 2012-05-14 MED ORDER — SODIUM CHLORIDE 0.9 % IV SOLN
10.0000 mg | INTRAVENOUS | Status: DC | PRN
  Administered 2012-05-14: 25 ug/min via INTRAVENOUS

## 2012-05-14 MED ORDER — LACTATED RINGERS IV SOLN
INTRAVENOUS | Status: DC
  Administered 2012-05-14: 16:00:00 via INTRAVENOUS

## 2012-05-14 MED ORDER — MAGNESIUM HYDROXIDE 400 MG/5ML PO SUSP
30.0000 mL | Freq: Every day | ORAL | Status: DC | PRN
  Administered 2012-05-17: 30 mL via ORAL
  Filled 2012-05-14: qty 30

## 2012-05-14 MED ORDER — SODIUM CHLORIDE 0.9 % IR SOLN
Status: DC | PRN
  Administered 2012-05-14: 3000 mL

## 2012-05-14 MED ORDER — ONDANSETRON HCL 4 MG/2ML IJ SOLN
INTRAMUSCULAR | Status: DC | PRN
  Administered 2012-05-14: 4 mg via INTRAVENOUS

## 2012-05-14 MED ORDER — GLYCOPYRROLATE 0.2 MG/ML IJ SOLN
INTRAMUSCULAR | Status: DC | PRN
  Administered 2012-05-14: .6 mg via INTRAVENOUS

## 2012-05-14 MED ORDER — ACETAMINOPHEN 325 MG PO TABS
650.0000 mg | ORAL_TABLET | Freq: Four times a day (QID) | ORAL | Status: DC | PRN
  Administered 2012-05-16 – 2012-05-19 (×2): 650 mg via ORAL
  Filled 2012-05-14: qty 2
  Filled 2012-05-14 (×2): qty 1

## 2012-05-14 MED ORDER — MORPHINE SULFATE 2 MG/ML IJ SOLN
0.5000 mg | INTRAMUSCULAR | Status: DC | PRN
  Administered 2012-05-14 – 2012-05-15 (×2): 0.5 mg via INTRAVENOUS
  Filled 2012-05-14 (×2): qty 1

## 2012-05-14 MED ORDER — METOCLOPRAMIDE HCL 10 MG PO TABS: 5.0000 mg | ORAL_TABLET | Freq: Three times a day (TID) | ORAL | Status: DC | PRN

## 2012-05-14 MED ORDER — LEVOTHYROXINE SODIUM 112 MCG PO TABS
112.0000 ug | ORAL_TABLET | Freq: Every day | ORAL | Status: DC
  Administered 2012-05-14 – 2012-05-20 (×7): 112 ug via ORAL
  Filled 2012-05-14 (×9): qty 1

## 2012-05-14 MED ORDER — HYDROCODONE-ACETAMINOPHEN 5-325 MG PO TABS
1.0000 | ORAL_TABLET | Freq: Four times a day (QID) | ORAL | Status: DC | PRN
  Administered 2012-05-15 – 2012-05-19 (×9): 1 via ORAL
  Filled 2012-05-14 (×9): qty 1

## 2012-05-14 MED ORDER — VECURONIUM BROMIDE 10 MG IV SOLR
INTRAVENOUS | Status: DC | PRN
  Administered 2012-05-14: 4 mg via INTRAVENOUS

## 2012-05-14 MED ORDER — NEOSTIGMINE METHYLSULFATE 1 MG/ML IJ SOLN
INTRAMUSCULAR | Status: DC | PRN
  Administered 2012-05-14: 3 mg via INTRAVENOUS

## 2012-05-14 MED ORDER — DOCUSATE SODIUM 100 MG PO CAPS
100.0000 mg | ORAL_CAPSULE | Freq: Two times a day (BID) | ORAL | Status: DC
  Administered 2012-05-14 – 2012-05-19 (×9): 100 mg via ORAL
  Filled 2012-05-14 (×10): qty 1

## 2012-05-14 MED ORDER — FENTANYL CITRATE 0.05 MG/ML IJ SOLN
INTRAMUSCULAR | Status: DC | PRN
  Administered 2012-05-14 (×3): 50 ug via INTRAVENOUS

## 2012-05-14 MED ORDER — SERTRALINE HCL 25 MG PO TABS
25.0000 mg | ORAL_TABLET | Freq: Every day | ORAL | Status: DC
  Administered 2012-05-14 – 2012-05-21 (×8): 25 mg via ORAL
  Filled 2012-05-14 (×9): qty 1

## 2012-05-14 MED ORDER — CLINDAMYCIN PHOSPHATE 900 MG/50ML IV SOLN
900.0000 mg | INTRAVENOUS | Status: AC
  Administered 2012-05-14: 900 mg via INTRAVENOUS
  Filled 2012-05-14: qty 50

## 2012-05-14 MED ORDER — LIDOCAINE HCL (CARDIAC) 20 MG/ML IV SOLN
INTRAVENOUS | Status: DC | PRN
  Administered 2012-05-14: 50 mg via INTRAVENOUS

## 2012-05-14 MED ORDER — ONDANSETRON HCL 4 MG/2ML IJ SOLN: 4.0000 mg | Freq: Once | INTRAMUSCULAR | Status: DC | PRN

## 2012-05-14 MED ORDER — PHENOL 1.4 % MT LIQD
1.0000 | OROMUCOSAL | Status: DC | PRN
Start: 2012-05-14 — End: 2012-05-21

## 2012-05-14 MED ORDER — PHENYLEPHRINE HCL 10 MG/ML IJ SOLN
INTRAMUSCULAR | Status: DC | PRN
  Administered 2012-05-14: 120 ug via INTRAVENOUS
  Administered 2012-05-14: 40 ug via INTRAVENOUS
  Administered 2012-05-14 (×2): 80 ug via INTRAVENOUS

## 2012-05-14 MED ORDER — ADULT MULTIVITAMIN W/MINERALS CH
1.0000 | ORAL_TABLET | Freq: Every day | ORAL | Status: DC
  Administered 2012-05-14 – 2012-05-21 (×8): 1 via ORAL
  Filled 2012-05-14 (×9): qty 1

## 2012-05-14 MED ORDER — FERROUS SULFATE 325 (65 FE) MG PO TABS
325.0000 mg | ORAL_TABLET | Freq: Every day | ORAL | Status: DC
  Administered 2012-05-15 – 2012-05-21 (×7): 325 mg via ORAL
  Filled 2012-05-14 (×8): qty 1

## 2012-05-14 MED ORDER — LACTATED RINGERS IV SOLN
INTRAVENOUS | Status: DC | PRN
  Administered 2012-05-14: 15:00:00 via INTRAVENOUS

## 2012-05-14 MED ORDER — ONDANSETRON HCL 4 MG/2ML IJ SOLN
4.0000 mg | Freq: Four times a day (QID) | INTRAMUSCULAR | Status: DC | PRN
  Administered 2012-05-20: 4 mg via INTRAVENOUS
  Filled 2012-05-14: qty 2

## 2012-05-14 MED ORDER — HYDROMORPHONE HCL PF 1 MG/ML IJ SOLN: 0.2500 mg | INTRAMUSCULAR | Status: DC | PRN

## 2012-05-14 MED ORDER — ACETAMINOPHEN 650 MG RE SUPP: 650.0000 mg | Freq: Four times a day (QID) | RECTAL | Status: DC | PRN

## 2012-05-14 SURGICAL SUPPLY — 47 items
BLADE SAW SAG 73X25 THK (BLADE) ×1
BLADE SAW SGTL 73X25 THK (BLADE) ×1 IMPLANT
BLADE SURG ROTATE 9660 (MISCELLANEOUS) IMPLANT
BRUSH FEMORAL CANAL (MISCELLANEOUS) IMPLANT
CHLORAPREP W/TINT 26ML (MISCELLANEOUS) ×1 IMPLANT
CLOTH BEACON ORANGE TIMEOUT ST (SAFETY) ×2 IMPLANT
COVER BACK TABLE 24X17X13 BIG (DRAPES) IMPLANT
COVER SURGICAL LIGHT HANDLE (MISCELLANEOUS) ×2 IMPLANT
DRAPE HIP W/POCKET STRL (DRAPE) ×2 IMPLANT
DRAPE INCISE IOBAN 66X45 STRL (DRAPES) ×2 IMPLANT
DRAPE U-SHAPE 47X51 STRL (DRAPES) ×2 IMPLANT
DRILL BIT 7/64X5 (BIT) ×1 IMPLANT
DRSG MEPILEX BORDER 4X12 (GAUZE/BANDAGES/DRESSINGS) ×1 IMPLANT
DRSG MEPILEX BORDER 4X8 (GAUZE/BANDAGES/DRESSINGS) ×1 IMPLANT
DURAPREP 26ML APPLICATOR (WOUND CARE) ×1 IMPLANT
ELECT CAUTERY BLADE 6.4 (BLADE) ×2 IMPLANT
ELECT REM PT RETURN 9FT ADLT (ELECTROSURGICAL) ×2
ELECTRODE REM PT RTRN 9FT ADLT (ELECTROSURGICAL) ×1 IMPLANT
EVACUATOR 1/8 PVC DRAIN (DRAIN) IMPLANT
GLOVE BIO SURGEON STRL SZ7.5 (GLOVE) ×2 IMPLANT
GLOVE BIOGEL PI IND STRL 8 (GLOVE) ×1 IMPLANT
GLOVE BIOGEL PI INDICATOR 8 (GLOVE) ×1
GOWN STRL NON-REIN LRG LVL3 (GOWN DISPOSABLE) ×4 IMPLANT
HANDPIECE INTERPULSE COAX TIP (DISPOSABLE) ×2
KIT BASIN OR (CUSTOM PROCEDURE TRAY) ×2 IMPLANT
KIT ROOM TURNOVER OR (KITS) ×2 IMPLANT
MANIFOLD NEPTUNE II (INSTRUMENTS) ×2 IMPLANT
NS IRRIG 1000ML POUR BTL (IV SOLUTION) ×2 IMPLANT
PACK TOTAL JOINT (CUSTOM PROCEDURE TRAY) ×2 IMPLANT
PAD ARMBOARD 7.5X6 YLW CONV (MISCELLANEOUS) ×4 IMPLANT
PASSER SUT SWANSON 36MM LOOP (INSTRUMENTS) IMPLANT
SET HNDPC FAN SPRY TIP SCT (DISPOSABLE) IMPLANT
STAPLER VISISTAT 35W (STAPLE) ×2 IMPLANT
SUT ETHIBOND NAB CT1 #1 30IN (SUTURE) IMPLANT
SUT FIBERWIRE #2 38 REV NDL BL (SUTURE) ×6
SUT VIC AB 0 CT1 27 (SUTURE) ×4
SUT VIC AB 0 CT1 27XBRD ANBCTR (SUTURE) ×2 IMPLANT
SUT VIC AB 1 CT1 27 (SUTURE)
SUT VIC AB 1 CT1 27XBRD ANBCTR (SUTURE) ×2 IMPLANT
SUT VIC AB 2-0 CT1 27 (SUTURE) ×4
SUT VIC AB 2-0 CT1 TAPERPNT 27 (SUTURE) ×2 IMPLANT
SUTURE FIBERWR#2 38 REV NDL BL (SUTURE) IMPLANT
TOWEL OR 17X24 6PK STRL BLUE (TOWEL DISPOSABLE) ×2 IMPLANT
TOWEL OR 17X26 10 PK STRL BLUE (TOWEL DISPOSABLE) ×2 IMPLANT
TOWER CARTRIDGE SMART MIX (DISPOSABLE) IMPLANT
TRAY FOLEY CATH 14FR (SET/KITS/TRAYS/PACK) IMPLANT
WATER STERILE IRR 1000ML POUR (IV SOLUTION) ×4 IMPLANT

## 2012-05-14 NOTE — Progress Notes (Signed)
INITIAL NUTRITION ASSESSMENT  DOCUMENTATION CODES Per approved criteria  -Not Applicable   INTERVENTION: Ensure Complete po BID, each supplement provides 350 kcal and 13 grams of protein.- will add when diet advanced.   NUTRITION DIAGNOSIS: Inadequate oral intake related to inability to eat as evidenced by npo status.   Goal: Intake to meet >90% estimated needs.  Monitor:  PO intake, labs, weight trend  Reason for Assessment: Low Braden  79 y.o. female  Admitting Dx:   ASSESSMENT: Patient admitted from NH after fall with closed hip fx.  Per daughters.  Intake prior to admit was good with no difficulty chewing or swallowing.  Was on a regular consistency diet.  Was not taking any nutrition shakes but willing to when needed.  Weight loss of 6%, reflective of some dehydration. Patient was receiving iron, vitamin D and a MVI prior to admit.  Height: 5'4" per daughter.  162.6 cm  Weight: Wt Readings from Last 1 Encounters:  05/13/12 135 lb (61.236 kg)    Ideal Body Weight: 120 lbs  % Ideal Body Weight: 113  Wt Readings from Last 10 Encounters:  05/13/12 135 lb (61.236 kg)  05/13/12 135 lb (61.236 kg)    Usual Body Weight: 144 lbs 3 weeks ago per daughter  % Usual Body Weight: 94  BMI:  23- WNL  Estimated Nutritional Needs: Kcal: 1300-1400 Protein: 65-75 gm Fluid: 1.4L daily  Skin: wnl  Diet Order: NPO  EDUCATION NEEDS: -No education needs identified at this time   Intake/Output Summary (Last 24 hours) at 05/14/12 1131 Last data filed at 05/14/12 1100  Gross per 24 hour  Intake 332.67 ml  Output    305 ml  Net  27.67 ml    Last BM: unknown  Labs:   Lab 05/14/12 0543 05/13/12 1507 05/13/12 1324  NA 134* -- 137  K 4.6 -- 5.0  CL 101 -- 101  CO2 23 -- 27  BUN 22 -- 17  CREATININE 1.39* 1.22* 1.25*  CALCIUM 8.6 -- 9.4  MG -- -- --  PHOS -- -- --  GLUCOSE 97 -- 111*    CBG (last 3)  No results found for this basename: GLUCAP:3 in the  last 72 hours  Scheduled Meds:   . calcitonin (salmon)  1 spray Alternating Nares Daily  . levothyroxine  50 mcg Intravenous QAC breakfast  . timolol  1 drop Both Eyes QHS  . Travoprost (BAK Free)  1 drop Both Eyes QHS    Continuous Infusions:   . sodium chloride 40 mL/hr at 05/13/12 1900    Past Medical History  Diagnosis Date  . Aortic stenosis, severe   . Hypothyroid   . Macular degeneration   . Osteoarthritis (arthritis due to wear and tear of joints)   . Nephrolithiasis   . CVA (cerebral infarction) 2005    minor  . Hyperlipidemia   . Open angle primary glaucoma   . Dementia     Hx sun-downing    Past Surgical History  Procedure Date  . Cholecystectomy   . Cataract extraction   . Breast lumpectomy     Oran Rein, RD, LDN Clinical Inpatient Dietitian Pager:  [phone] Weekend and after hours pager:  [phone]

## 2012-05-14 NOTE — Transfer of Care (Signed)
Immediate Anesthesia Transfer of Care Note  Patient: [name]  Procedure(s) Performed: Procedure(s) (LRB) with comments: ARTHROPLASTY BIPOLAR HIP (Left)  Patient Location: PACU  Anesthesia Type:General  Level of Consciousness: awake  Airway & Oxygen Therapy: Patient Spontanous Breathing and Patient connected to face mask oxygen  Post-op Assessment: Report given to PACU RN and Post -op Vital signs reviewed and stable  Post vital signs: Reviewed and stable  Complications: No apparent anesthesia complications

## 2012-05-14 NOTE — Anesthesia Preprocedure Evaluation (Addendum)
Anesthesia Evaluation  Patient identified by MRN, date of birth, ID band Patient awake    Reviewed: Allergy & Precautions, H&P , NPO status , Patient's Chart, lab work & pertinent test results, reviewed documented beta blocker date and time   Airway Mallampati: II TM Distance: >3 FB Neck ROM: full    Dental   Pulmonary shortness of breath and with exertion,    + decreased breath sounds  rales    Cardiovascular + dysrhythmias + Valvular Problems/Murmurs ('14 ECHO: AVA 0.35 cm2, EF 60-65%) AS Rhythm:irregular + Systolic murmurs    Neuro/Psych PSYCHIATRIC DISORDERS (dementia) CVA    GI/Hepatic negative GI ROS, Neg liver ROS,   Endo/Other  Hypothyroidism   Renal/GU Renal disease  negative genitourinary   Musculoskeletal   Abdominal   Peds  Hematology negative hematology ROS (+)   Anesthesia Other Findings See surgeon's H&P   Reproductive/Obstetrics negative OB ROS                           Anesthesia Physical Anesthesia Plan  ASA: IV  Anesthesia Plan: General   Post-op Pain Management:    Induction: Intravenous  Airway Management Planned: Oral ETT  Additional Equipment: Arterial line  Intra-op Plan:   Post-operative Plan: Extubation in OR  Informed Consent: I have reviewed the patients History and Physical, chart, labs and discussed the procedure including the risks, benefits and alternatives for the proposed anesthesia with the patient or authorized representative who has indicated his/her understanding and acceptance.   Dental Advisory Given  Plan Discussed with: CRNA, Surgeon and Anesthesiologist  Anesthesia Plan Comments:        Anesthesia Quick Evaluation

## 2012-05-14 NOTE — Progress Notes (Signed)
Patient ID: [name], female   DOB: Oct 22, 1914, 79 y.o.   MRN: [id number]   A complete cardiology consult note was done by Dr. Antoine Poche yesterday. We will follow and try to help if we can. No further cardiac workup at this time.  Jerral Bonito, MD

## 2012-05-14 NOTE — Progress Notes (Signed)
PT Cancellation Note  Patient Details Name: MACKENSEY BOLTE MRN: [id number] DOB: 08-Aug-1914   Cancelled Treatment:    Reason Eval/Treat Not Completed: Other (comment) (Noted pt on OR schedule today.  )  Please write new orders for PT post-op if appropriate.  Will sign off at this time.     Sunny Schlein, [place] [phone] 05/14/2012, 7:31 AM

## 2012-05-14 NOTE — Preoperative (Signed)
Beta Blockers   Reason not to administer Beta Blockers:Not Applicable 

## 2012-05-14 NOTE — Progress Notes (Signed)
Sw will await PT evaluation post-op to determine if patient needs SNF placement. IF not, SW will help patient with d/c back to her ALF.  Sabino Niemann, MSW [phone]

## 2012-05-14 NOTE — Anesthesia Postprocedure Evaluation (Signed)
  Anesthesia Post-op Note  Patient: [name]  Procedure(s) Performed: Procedure(s) (LRB) with comments: ARTHROPLASTY BIPOLAR HIP (Left)  Patient Location: PACU  Anesthesia Type:General  Level of Consciousness: awake  Airway and Oxygen Therapy: Patient Spontanous Breathing  Post-op Pain: mild  Post-op Assessment: Post-op Vital signs reviewed, Patient's Cardiovascular Status Stable, Respiratory Function Stable, Patent Airway, No signs of Nausea or vomiting and Pain level controlled  Post-op Vital Signs: stable  Complications: No apparent anesthesia complications

## 2012-05-14 NOTE — Op Note (Signed)
05/13/2012 - 05/14/2012  6:00 PM  PATIENT:  [name]  79 y.o. female  PRE-OPERATIVE DIAGNOSIS:  Displaced left femoral neck fracture  POST-OPERATIVE DIAGNOSIS:  Same  PROCEDURE:  Left hip uncemented hemiarthroplasty, diffuse [place] uncemented, size 4, 48 mm 0 neck  SURGEON: Cliffton Asters. Janee Morn, MD  PHYSICIAN ASSISTANT: None  ANESTHESIA:  general  SPECIMENS:  None  DRAINS:   None  PREOPERATIVE INDICATIONS:  [name] is a  79 y.o. female with a diagnosis of displaced left femoral neck fracture.  A long discussion was held with patient's family after a risk assessment was performed by cardiology. The patient and family wished to proceed with surgery after acknowledgment of the degree of risk as extremely high.  The risks benefits and alternatives were discussed with the patient preoperatively including but not limited to the risks of infection, bleeding, nerve injury, cardiopulmonary complications, the need for revision surgery, among others, and the patient verbalized understanding and appropriate consent was obtained.  OPERATIVE IMPLANTS: See above  OPERATIVE FINDINGS: Displaced femoral neck fracture, no evidence for pathologic fracture  OPERATIVE PROCEDURE:  After receiving prophylactic antibiotics, the patient was escorted to the operative theatre and placed in a supine position.  General anesthesia was administered. A surgical "time-out" was performed during which the planned procedure, proposed operative site, and the correct patient identity were compared to the operative consent and agreement confirmed by the circulating nurse according to current facility policy. The patient was repositioned lateral, left side up with care to pad the appropriate downside pressure points. Entire left lower extremity was prepped with DuraPrep and draped in usual sterile fashion. A curvilinear incision was fashioned over the proximal femur extending posteriorly into the buttock. The skin was  incised sharply with a scalpel, subcutaneous tissues with Bovie electric cautery. The deep fascia was then split in line with the skin incision retracted with a Charnley retractor. One long musculotendinous sleeve was elevated off the anterolateral aspect of the proximal femur getting near the posterior edge of the vastus, coursing superiorly near the ridge and then turning anteriorly before coming off superiorly again thus detaching the anterior third of the abductor musculature together with the capsule. Provisional neck cut was made. The head was removed and sized. A 48 head was trialed. This was a socket was copiously irrigated. The canal was then entered with a canal finder followed bilateral lysing or. Broaches were used up to a size 4 which appeared to be the appropriate size. Calcar reamer was employed.  48 head was trialed with a 0 neck, and this appeared to be the appropriate size. The knee could be flexed past 90 and that was the appropriate degree of soft tissue tension with push/pull. Leg lengths appeared to be fairly symmetrical. The canal was then irrigated with pulsatile lavage as was the socket and the holes were placed into the greater trochanter to allow for augmented reattachment of the anterior abductors. FiberWire, #2, was placed through the holes and then the implant was placed. Monopolar 48 head on a 0 neck was then impacted and reduced. The prosthesis was reduced and placed through range of motion again. It was stable. The anterior capsule and abductors were then repaired with the #2 FiberWire in a series of horizontal mattress and then running sutures. The layer was copiously irrigated. The deep fascia was closed with combination running and interrupted 0 Vicryl suture. Scarpa's layer was closed with interrupted sutures. The skin was then reapproximated with 2-0 Vicryl deep dermal  buried sutures followed by staples at the skin. Each step was closure was independently irrigated. A Mepilex  dressing was applied and the patient was awakened and taken to the recovery room in stable condition breathing spontaneously with an abduction pillow in place  DISPOSITION: Patient will be returned to the floor on the fractured hip care combined pathway with the hospitalists. She may be weightbearing as tolerated with anterior dislocation precautions.

## 2012-05-14 NOTE — Progress Notes (Signed)
Utilization review completed. Virat Prather, RN, BSN. 

## 2012-05-14 NOTE — Progress Notes (Signed)
[facility] PROGRESS NOTE  [name] ZOX:[id number] DOB: 08-30-1914 DOA: 05/13/2012 PCP: Gwen Pounds, MD  Assessment/Plan: Active Problems:  Fall  Closed left hip fracture  Aortic stenosis  Hypothyroidism  Urolithiasis  History of CVA (cerebrovascular accident)  Dementia    1. Fall: Patient presented following what appears to have been a mechanical fall, although precise circumstances are unclear at this time. This is complicated by a left hip fracture. See below.  2. Closed left hip fracture: This is secondary to #1. Patient was ambulant prior to fall, but is clearly at very high risk for surgical intervention, given known history of severe aortic stenosis. Managing with analgesics. Cardiology team was consulted for surgical clearance (See comments in #3 below). Dr Mack Hook provided orthopedic consultation, and patient is scheduled for left hip hemiarthroplasty today.  3. Aortic stenosis: Patient has known severe aortic stenosis, with valvular area of 0.72, when evaluated by Dr Arvilla Meres in 2012. At presentaiton, she had no symptoms of chest pain or over CHF. Dr Rollene Rotunda provided cardiology consultation, and has opined that patient has mild LV dysfunction and severe/critical aortic stenosis, not amenable to mitigation with any medical or interventional therapy at this point, and is at high risk for repair based on her age, frailty and AS. Family would wish to proceed with high risk surgery as quality of life is paramount in their minds. 4. Dehydration: Patient appeared clinically mildly dehydrated at presentation, although lab work was not immediately available. Commenced on gentle iv fluids, at 40 cc/hr. Creatinine has crept up today,and patient's lungs remain clear t auscultation. Will change iv fluid to 50 cc/hr.  5. Hypothyroidism: Continued on thyroxine replacement therapy, albeit iv, for now.  6. Urolithiasis: Per history. This does not appear problematic at  this time. Urinalysis is negative for UTI.  7. History of CVA (cerebrovascular accident): Stable.  8. Dementia: Known dementia, with episodes of sun-downing. Will maintain fall precautions.    Code Status: DNR/DNI Family Communication:  Disposition Plan: To be determined.    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 Precise circumstances are unclear, but patient's daughter [name] (Tel: [phone]) was contacted at 4:00 AM on 05/13/12, by staff at ALF, to inform her that patient had fallen, while in the bathroom. She was taken to [facility], where imaging studies revealed a left femoral neck fracture.   Consultants:  Dr Emerson Monte, orthopedic surgeon.  Dr Rollene Rotunda, cardiologist.   Procedures:  X-ray left femur.  X-Ray Hip.  CXR.   Antibiotics:  N/A.   HPI/Subjective: No new issues.   Objective: Vital signs in last 24 hours: Temp:  [97.4 F (36.3 C)-98.7 F (37.1 C)] 97.9 F (36.6 C) (01/31 0610) Pulse Rate:  [54-89] 89  (01/31 0610) Resp:  [16-18] 18  (01/31 0610) BP: (127-167)/(42-60) 127/54 mmHg (01/31 0610) SpO2:  [96 %-100 %] 98 % (01/31 0610) Weight:  [61.236 kg (135 lb)] 61.236 kg (135 lb) (01/30 1148) Weight change:  Last BM Date: 05/12/12  Intake/Output from previous day: 01/30 0701 - 01/31 0700 In: 332.7 [I.V.:332.7] Out: 165 [Urine:165]     Physical Exam:  General: Alert, not communicative, not in acute discomfort, not short of breath at  rest.  HEENT: Mild clinical pallor, no jaundice, no conjunctival injection or discharge. Visible buccal mucosa appears "dry".  NECK: Supple, JVP not seen, no carotid bruits, no palpable lymphadenopathy, no palpable goiter.  CHEST: Clinically clear to auscultation, no wheezes, no crackles.  HEART: Sounds 1 and 2 heard, normal, regular, high-pitched late systolic murmur.  ABDOMEN: Full, soft, non-tender, no palpable organomegaly, no palpable masses, normal bowel sounds.  GENITALIA: Not examined.  LOWER EXTREMITIES: No pitting edema, palpable peripheral pulses.  MUSCULOSKELETAL SYSTEM: Patient not moving left leg/hip, secondary to pain. Generalized osteoarthritic changes, bruising over left knee.  CENTRAL NERVOUS SYSTEM: No focal neurologic deficit on gross examination.  Lab Results:  Basename 05/14/12 0543 05/13/12 1507  WBC 8.9 10.7*  HGB 11.8* 12.8  HCT 34.7* 37.8  PLT 180 190    Basename 05/14/12 0543 05/13/12 1507 05/13/12 1324  NA 134* -- 137  K 4.6 -- 5.0  CL 101 -- 101  CO2 23 -- 27  GLUCOSE 97 -- 111*  BUN 22 -- 17  CREATININE 1.39* 1.22* --  CALCIUM 8.6 -- 9.4   Recent Results (from the past 240 hour(s))  MRSA PCR SCREENING     Status: Normal   Collection Time   05/13/12  4:35 PM      Component Value Range Status Comment   MRSA by PCR NEGATIVE  NEGATIVE Final      Studies/Results: Dg Femur Left  05/13/2012  *RADIOLOGY REPORT*  Clinical Data: Larey Seat.  LEFT FEMUR - 2 VIEW  Comparison: Left hip films same date.  Findings: Left femoral neck fracture with major fracture fragments in varus angulation.  No other femur fracture is noted. No obvious knee fracture.  If there is knee pain, standard knee films recommended.  IMPRESSION: Left femoral neck fracture.   Original Report Authenticated By: Lacy Duverney, M.D.    Dg Chest Port 1 View  05/13/2012  *RADIOLOGY REPORT*  Clinical Data: Hip fracture  PORTABLE CHEST - 1 VIEW  Comparison: 05/15/2010  Findings: Study is limited by  poor inspiration.  There is central vascular congestion.  No acute infiltrate or pulmonary edema.  Mild bronchitic changes.  Atherosclerotic calcifications of thoracic aorta.  IMPRESSION: Central vascular congestion without pulmonary edema. Mild bronchitic changes.   Original Report Authenticated By: Natasha Mead, M.D.    Dg Hip Portable 1 View Left  05/13/2012  *RADIOLOGY REPORT*  Clinical Data: Question hip fracture  PORTABLE LEFT HIP - 1 VIEW  Comparison: None.  Findings: Single portable view of the left hip submitted.  There is displaced fracture of the left femoral neck.  IMPRESSION: Displaced fracture of the left femoral neck.  This was made a call report.   Original Report Authenticated By: Natasha Mead, M.D.     Medications: Scheduled Meds:   . calcitonin (salmon)  1 spray Alternating Nares Daily  . levothyroxine  50 mcg Intravenous QAC breakfast  . timolol  1 drop Both Eyes QHS  . Travoprost (BAK Free)  1 drop Both Eyes QHS   Continuous Infusions:   . sodium chloride 40 mL/hr at 05/13/12 1900   PRN Meds:.acetaminophen, morphine injection    LOS: 1 day   Demi Trieu,CHRISTOPHER  [facility] Pager [phone]. If 8PM-8AM, please contact night-coverage at [email or web address], password [facility] 05/14/2012, 8:17 AM  LOS: 1 day

## 2012-05-15 LAB — BASIC METABOLIC PANEL
BUN: 29 mg/dL — ABNORMAL HIGH (ref 6–23)
CO2: 24 mEq/L (ref 19–32)
Chloride: 103 mEq/L (ref 96–112)
GFR calc Af Amer: 33 mL/min — ABNORMAL LOW (ref >90)
Glucose, Bld: 114 mg/dL — ABNORMAL HIGH (ref 70–99)
Potassium: 4.7 mEq/L (ref 3.5–5.1)

## 2012-05-15 LAB — CBC
HCT: 28.4 % — ABNORMAL LOW (ref 36.0–46.0)
Hemoglobin: 9.5 g/dL — ABNORMAL LOW (ref 12.0–15.0)
MCV: 91.6 fL (ref 78.0–100.0)
RBC: 3.1 MIL/uL — ABNORMAL LOW (ref 3.87–5.11)
WBC: 7.9 10*3/uL (ref 4.0–10.5)

## 2012-05-15 MED ORDER — SODIUM CHLORIDE 0.9 % IV SOLN: INTRAVENOUS | Status: AC

## 2012-05-15 MED ORDER — SENNA 8.6 MG PO TABS
1.0000 | ORAL_TABLET | Freq: Every day | ORAL | Status: DC
  Administered 2012-05-15 – 2012-05-18 (×4): 8.6 mg via ORAL
  Filled 2012-05-15 (×5): qty 1

## 2012-05-15 MED ORDER — HYDROCODONE-ACETAMINOPHEN 5-325 MG PO TABS: 1.0000 | ORAL_TABLET | ORAL | Status: DC | PRN

## 2012-05-15 NOTE — Evaluation (Signed)
Physical Therapy Evaluation Patient Details Name: [name] MRN: [id number] DOB: 12/10/1914 Today's Date: 05/15/2012 Time: 4098-1191 PT Time Calculation (min): 24 min  PT Assessment / Plan / Recommendation Clinical Impression  Pt is a 79 y/o female s/p L THA secondary to fall related hip fx.  Pt had what appeared to be a syncopal episode during session.  Unresponsive for several seconds but became responsive with painful stimuls of moving L HIP. Pt will be followed by acute PT to progres activity tolerance.  Suggesting SNF placement for continued rehab.      PT Assessment  Patient needs continued PT services    Follow Up Recommendations  SNF;Supervision/Assistance - 24 hour    Does the patient have the potential to tolerate intense rehabilitation      Barriers to Discharge        Equipment Recommendations  None recommended by PT    Recommendations for Other Services     Frequency Min 3X/week    Precautions / Restrictions Precautions Precautions: Anterior Hip Precaution Booklet Issued: Yes (comment) Precaution Comments: Pt has wedge between her knees but has anterior precautions.  Wedge should not be necessary as hip adduction is not an anterior precaution.   Restrictions Weight Bearing Restrictions: Yes LLE Weight Bearing: Weight bearing as tolerated   Pertinent Vitals/Pain C/o of hip pain but unable to qualify      Mobility  Bed Mobility Bed Mobility: Supine to Sit;Sitting - Scoot to Edge of Bed Supine to Sit: 1: +2 Total assist Supine to Sit: Patient Percentage: 10% Sitting - Scoot to Edge of Bed: 1: +2 Total assist Sitting - Scoot to Edge of Bed: Patient Percentage: 10% Details for Bed Mobility Assistance: Assist to manage bilateral LEs and trunk secondary to pain.   Transfers Transfers: Sit to Stand;Stand to Sit;Stand Pivot Transfers Sit to Stand: 1: +2 Total assist;From bed;With upper extremity assist (two trials. ) Sit to Stand: Patient Percentage: 20% Stand  to Sit: 1: +2 Total assist;To bed;To chair/3-in-1 Stand to Sit: Patient Percentage: 10% Stand Pivot Transfers: 1: +2 Total assist Stand Pivot Transfers: Patient Percentage: 10% Details for Transfer Assistance: Assist to manage pt's body wt.  Verbal and tactile cues for hand placement. Assist to control descent to chair.  Pt's feet became entangled during stand pivot transfer as she could not advance either LE.  Required manual facilitation to untangle LEs.   Ambulation/Gait Ambulation/Gait Assistance: Not tested (comment)    Shoulder Instructions     Exercises     PT Diagnosis: Difficulty walking;Generalized weakness;Acute pain  PT Problem List: Decreased strength;Decreased range of motion;Decreased activity tolerance;Decreased mobility;Decreased knowledge of use of DME;Decreased knowledge of precautions;Pain PT Treatment Interventions: Gait training;DME instruction;Functional mobility training;Therapeutic activities;Therapeutic exercise;Patient/family education   PT Goals Acute Rehab PT Goals PT Goal Formulation: With patient Time For Goal Achievement: 05/29/12 Potential to Achieve Goals: Fair Pt will go Supine/Side to Sit: with mod assist PT Goal: Supine/Side to Sit - Progress: Goal set today Pt will go Sit to Supine/Side: with mod assist;with HOB 0 degrees PT Goal: Sit to Supine/Side - Progress: Goal set today Pt will go Sit to Stand: with mod assist PT Goal: Sit to Stand - Progress: Goal set today Pt will go Stand to Sit: with mod assist PT Goal: Stand to Sit - Progress: Goal set today Pt will Transfer Bed to Chair/Chair to Bed: with mod assist PT Transfer Goal: Bed to Chair/Chair to Bed - Progress: Goal set today  Visit Information  Last  PT Received On: 05/15/12 Assistance Needed: +2    Subjective Data  Subjective: agree to PT eval.  SON answered most of the questions.     Prior Functioning  Home Living Lives With: Other (Comment) (ALF) Available Help at Discharge:  Skilled Nursing Facility Prior Function Level of Independence: Independent with assistive device(s) Driving: No Vocation: Retired Musician: Clinical cytogeneticist  Overall Cognitive Status: Appears within functional limits for tasks assessed/performed Arousal/Alertness: Awake/alert Orientation Level: Appears intact for tasks assessed Behavior During Session: [facility] for tasks performed Cognition - Other Comments: Pt stated that she was in the hospital because she fell and broke her back.      Extremity/Trunk Assessment Right Lower Extremity Assessment RLE ROM/Strength/Tone: WFL for tasks assessed Left Lower Extremity Assessment LLE ROM/Strength/Tone: Unable to fully assess;Due to pain   Balance Balance Balance Assessed: Yes Static Sitting Balance Static Sitting - Balance Support: Bilateral upper extremity supported;Feet supported Static Sitting - Level of Assistance: 5: Stand by assistance Static Sitting - Comment/# of Minutes: 3+ minutes sitting on EOB with no LOB.   c/o nausea.    End of Session PT - End of Session Equipment Utilized During Treatment: Gait belt Activity Tolerance: Patient limited by fatigue;Treatment limited secondary to medical complications (Comment);Patient limited by pain Patient left: in chair;with call bell/phone within reach;with nursing in room;with family/visitor present Nurse Communication: Mobility status;Need for lift equipment;Weight bearing status;Precautions  GP     Terika Pillard 05/15/2012, 11:17 AM Tina Gruner L. Adyan Palau DPT [phone]

## 2012-05-15 NOTE — Progress Notes (Signed)
Subjective: Patient awake, follows command to move foot, but does not verbally reply to questions   Objective: Vital signs in last 24 hours: Temp:  [97.4 F (36.3 C)-98.4 F (36.9 C)] 98 F (36.7 C) (02/01 0612) Pulse Rate:  [66-81] 81  (02/01 0612) Resp:  [12-20] 18  (02/01 0612) BP: (110-151)/(42-55) 110/49 mmHg (02/01 0612) SpO2:  [91 %-99 %] 96 % (02/01 0612) Arterial Line BP: (138-158)/(46-55) 148/55 mmHg (01/31 1834)  Intake/Output from previous day: 01/31 0701 - 02/01 0700 In: 950 [I.V.:950] Out: 680 [Urine:480; Blood:200] Intake/Output this shift:     Basename 05/15/12 0615 05/14/12 0543 05/13/12 1507 05/13/12 1324  HGB 9.5* 11.8* 12.8 12.6    Basename 05/15/12 0615 05/14/12 0543  WBC 7.9 8.9  RBC 3.10* 3.75*  HCT 28.4* 34.7*  PLT 125* 180    Basename 05/15/12 0615 05/14/12 0543  NA 137 134*  K 4.7 4.6  CL 103 101  CO2 24 23  BUN 29* 22  CREATININE 1.49* 1.39*  GLUCOSE 114* 97  CALCIUM 8.2* 8.6    Basename 05/13/12 1507  LABPT --  INR 1.06    dressing with scant spotting; flex/ext ankle/toes to sole stroke  Assessment/Plan: Continue hip fx pathway Will defer anemia (HGB 9.5) management to hospitalist/cardiology due to pt's baseline cardiac status Dr. Gean Birchwood covering remainder of weekend. Start PT today, WBAT, anterior hip dislocation precautions May D/C once medically stable and suitable D/C plan in place F/u 2 weeks postop with me.   [name] A. 05/15/2012, 7:21 AM

## 2012-05-15 NOTE — Progress Notes (Addendum)
Clinical Social Work Department CLINICAL SOCIAL WORK PLACEMENT NOTE 05/15/2012  Patient:  [name], [name]  Account Number:  [id number] Admit date:  05/13/2012  Clinical Social Worker:  Unk Lightning, LCSW  Date/time:  05/15/2012 03:00 PM  Clinical Social Work is seeking post-discharge placement for this patient at the following level of care:   SKILLED NURSING   (*CSW will update this form in Epic as items are completed)   05/15/2012  Patient/family provided with [facility] Department of Clinical Social Work's list of facilities offering this level of care within the geographic area requested by the patient (or if unable, by the patient's family).  05/15/2012  Patient/family informed of their freedom to choose among providers that offer the needed level of care, that participate in Medicare, Medicaid or managed care program needed by the patient, have an available bed and are willing to accept the patient.  05/15/2012  Patient/family informed of [facility]' ownership interest in [facility], as well as of the fact that they are under no obligation to receive care at this facility.  PASARR submitted to EDS on existing # PASARR number received from EDS on   FL2 transmitted to all facilities in geographic area requested by pt/family on  05/15/2012 FL2 transmitted to all facilities within larger geographic area on   Patient informed that his/her managed care company has contracts with or will negotiate with  certain facilities, including the following:     Patient/family informed of bed offers received:  05/17/2012 Patient chooses bed at [facility] Physician recommends and patient chooses bed at    Patient to be transferred to  on   Patient to be transferred to facility by   The following physician request were entered in Epic:   Additional Comments:

## 2012-05-15 NOTE — Progress Notes (Signed)
[facility] PROGRESS NOTE  BRITIANY SILBERNAGEL WUJ:[id number] DOB: [birth year] DOA: 05/13/2012 PCP: Gwen Pounds, MD  Assessment/Plan: Active Problems:  Fall  Closed left hip fracture  Aortic stenosis  Hypothyroidism  Urolithiasis  History of CVA (cerebrovascular accident)  Dementia    1. Fall: Patient presented following what appears to have been a mechanical fall, although precise circumstances are unclear at this time. This was complicated by a left hip fracture. See below.  2. Closed left hip fracture: This is secondary to #1. Patient was ambulant prior to fall, but was clearly at very high risk for surgical intervention, given known history of severe aortic stenosis.  Dr Rollene Rotunda provided cardiology consultation, and opined that patient has mild LV dysfunction and severe/critical aortic stenosis, not amenable to mitigation with any medical or interventional therapy at this point, and is at high risk for repair based on her age, frailty and AS. Family opted to proceed with high risk surgery as quality of life is paramount in their minds. Dr Mack Hook provided orthopedic consultation, and patient underwent left hip hemiarthroplasty on 05/14/12. Now POD#1 and patient appears stable. Managing per orthopedics. For PT/OT/Rehab.  3. Anemia: HB at presentation was 12.8, and post-operatively, has dropped to 9.5 as of 05/15/12. This is ABLA, due to surgery, but appears reasonable at this time. Following CBC,and will transfuse prn.  4. Aortic stenosis: Patient has known severe aortic stenosis, with valvular area of 0.72, when evaluated by Dr Arvilla Meres in 2012. At presentation, she had no symptoms of chest pain or overt CHF. She remains stable clinically. Close attention is being paid to volume status.  5. Dehydration: Patient appeared clinically mildly dehydrated at presentation, although lab work was not immediately available at that time. Commenced on gentle iv fluids. Will continue  today, at 40 cc/hr, but discontinue, once oral intake is satisfactory, possibly on 05/16/12.  6. Hypothyroidism: Continued on thyroxine replacement therapy, initially iv, but will transition to oral formulation, today.  7. Urolithiasis: Per history. This does not appear problematic at this time. Urinalysis is negative for UTI.  8. History of CVA (cerebrovascular accident): Stable.  9. Dementia: Known dementia, with episodes of sun-downing. Stable. Will maintain fall precautions.    Code Status: DNR/DNI Family Communication:  Disposition Plan: To be determined.    Brief narrative: 79 year old female resident of [facility], ALF in [place], with known history of Hypothyroidism, Glaucoma, open angle, Hyperlipidemia, Kidney stones, Macular degeneration, Osteoarthritis, s/p left internal capsule CVA 05/2003, severe aortic stenosis (Valve area 0.72) and aortic insufficiency, s/p benign breast biopsy/lumpectomy, s/p cholecystectomy, s/p cataract surgery/intraocular lens implants, dementia with sun-downing, transferred from [facility], per family request, following a fall. Precise circumstances are unclear, but patient's daughter [name] (Tel: [phone]) was contacted at 4:00 AM on 05/13/12, by staff at ALF, to inform her that patient had fallen, while in the bathroom. She was taken to [facility], where imaging studies revealed a left femoral neck fracture.   Consultants:  Dr Emerson Monte, orthopedic surgeon.  Dr Rollene Rotunda, cardiologist.   Procedures:  X-ray left femur.  X-Ray Hip.  CXR.   Antibiotics:  N/A.   HPI/Subjective: Alert, following simple commands accurately.   Objective: Vital signs in last 24 hours: Temp:  [97.4 F (36.3 C)-98.4 F (36.9 C)] 98 F (36.7 C) (02/01 0612) Pulse Rate:  [66-81] 81  (02/01 0612) Resp:  [12-18] 18  (02/01 0612) BP: (110-151)/(42-55) 110/49 mmHg (02/01 0612) SpO2:  [91 %-  97 %] 96 % (02/01  0612) Arterial Line BP: (138-158)/(46-55) 148/55 mmHg (01/31 1834) Weight change:  Last BM Date: 05/12/12  Intake/Output from previous day: 01/31 0701 - 02/01 0700 In: 950 [I.V.:950] Out: 680 [Urine:480; Blood:200]     Physical Exam: General: Alert, communicative, albeit somewhat disoriented, asking for daughter, not in acute discomfort, not short of breath at rest. Following simple commands accurately.  HEENT: Mild clinical pallor, no jaundice, no conjunctival injection or discharge.  NECK: Supple, JVP not seen, no carotid bruits, no palpable lymphadenopathy, no palpable goiter.  CHEST: Clinically clear to auscultation, no wheezes, no crackles.  HEART: Sounds 1 and 2 heard, normal, regular, high-pitched late systolic murmur.  ABDOMEN: Full, soft, non-tender, no palpable organomegaly, no palpable masses, normal bowel sounds.  GENITALIA: Not examined.  LOWER EXTREMITIES: No pitting edema, palpable peripheral pulses.  MUSCULOSKELETAL SYSTEM: Surgical site appears clean and dry. Both legs are restrained.  CENTRAL NERVOUS SYSTEM: No focal neurologic deficit on gross examination.  Lab Results:  [facility] 05/15/12 0615 05/14/12 0543  WBC 7.9 8.9  HGB 9.5* 11.8*  HCT 28.4* 34.7*  PLT 125* 180    Basename 05/15/12 0615 05/14/12 0543  NA 137 134*  K 4.7 4.6  CL 103 101  CO2 24 23  GLUCOSE 114* 97  BUN 29* 22  CREATININE 1.49* 1.39*  CALCIUM 8.2* 8.6   Recent Results (from the past 240 hour(s))  URINE CULTURE     Status: Normal   Collection Time   05/13/12  4:34 PM      Component Value Range Status Comment   Specimen Description URINE, CLEAN CATCH   Final    Special Requests NONE   Final    Culture  Setup Time 05/13/2012 17:54   Final    Colony Count 50,000 COLONIES/ML   Final    Culture     Final    Value: Multiple bacterial morphotypes present, none predominant. Suggest appropriate recollection if clinically indicated.   Report Status 05/14/2012 FINAL   Final   MRSA PCR  SCREENING     Status: Normal   Collection Time   05/13/12  4:35 PM      Component Value Range Status Comment   MRSA by PCR NEGATIVE  NEGATIVE Final      Studies/Results: Dg Femur Left  05/13/2012  *RADIOLOGY REPORT*  Clinical Data: Larey Seat.  LEFT FEMUR - 2 VIEW  Comparison: Left hip films same date.  Findings: Left femoral neck fracture with major fracture fragments in varus angulation.  No other femur fracture is noted. No obvious knee fracture.  If there is knee pain, standard knee films recommended.  IMPRESSION: Left femoral neck fracture.   Original Report Authenticated By: Lacy Duverney, M.D.    Dg Pelvis Portable  05/14/2012  *RADIOLOGY REPORT*  Clinical Data: Post left hip arthroplasty  PORTABLE PELVIS  Comparison: Left hip radiographs - 05/13/2012  Findings:  Post left hip hemiarthroplasty. Alignment appears near anatomic on this AP projection radiograph.  No definite fracture.  There is a minimal amount of expected subcutaneous emphysema and soft tissue stranding about the operative site.  Skin staples are seen about the lateral aspect of the thigh.  No radiopaque foreign body.  IMPRESSION: Post left hip hemiarthroplasty without definite evidence of complication.   Original Report Authenticated By: Tacey Ruiz, MD    Dg Chest Port 1 View  05/13/2012  *RADIOLOGY REPORT*  Clinical Data: Hip fracture  PORTABLE CHEST - 1 VIEW  Comparison: 05/15/2010  Findings: Study is limited by poor inspiration.  There is central vascular congestion.  No acute infiltrate or pulmonary edema.  Mild bronchitic changes.  Atherosclerotic calcifications of thoracic aorta.  IMPRESSION: Central vascular congestion without pulmonary edema. Mild bronchitic changes.   Original Report Authenticated By: Natasha Mead, M.D.    Dg Hip Portable 1 View Left  05/13/2012  *RADIOLOGY REPORT*  Clinical Data: Question hip fracture  PORTABLE LEFT HIP - 1 VIEW  Comparison: None.  Findings: Single portable view of the left hip submitted.   There is displaced fracture of the left femoral neck.  IMPRESSION: Displaced fracture of the left femoral neck.  This was made a call report.   Original Report Authenticated By: Natasha Mead, M.D.     Medications: Scheduled Meds:    . aspirin EC  325 mg Oral Q breakfast  . calcitonin (salmon)  1 spray Alternating Nares Daily  . cholecalciferol  2,000 Units Oral Daily  . docusate sodium  100 mg Oral BID  . ferrous sulfate  325 mg Oral Q breakfast  . levothyroxine  112 mcg Oral QHS  . multivitamin with minerals  1 tablet Oral Daily  . sertraline  25 mg Oral Daily  . timolol  1 drop Both Eyes QHS  . Travoprost (BAK Free)  1 drop Both Eyes QHS   Continuous Infusions:  PRN Meds:.acetaminophen, acetaminophen, HYDROcodone-acetaminophen, magnesium hydroxide, menthol-cetylpyridinium, metoCLOPramide (REGLAN) injection, metoCLOPramide, morphine injection, ondansetron (ZOFRAN) IV, ondansetron, phenol    LOS: 2 days   Eliza Grissinger,CHRISTOPHER  [facility] Pager [phone]. If 8PM-8AM, please contact night-coverage at [email or web address], password [facility] 05/15/2012, 8:05 AM  LOS: 2 days

## 2012-05-15 NOTE — Progress Notes (Signed)
Physical Therapy Treatment Patient Details Name: [name] MRN: [id number] DOB: 06/28/1914 Today's Date: 05/15/2012 Time: 1430-1450 PT Time Calculation (min): 20 min  PT Assessment / Plan / Recommendation Comments on Treatment Session       Follow Up Recommendations  SNF;Supervision/Assistance - 24 hour     Does the patient have the potential to tolerate intense rehabilitation     Barriers to Discharge        Equipment Recommendations  None recommended by PT    Recommendations for Other Services    Frequency Min 3X/week   Plan Discharge plan remains appropriate;Frequency remains appropriate    Precautions / Restrictions Precautions Precautions: Anterior Hip Restrictions Weight Bearing Restrictions: Yes LLE Weight Bearing: Weight bearing as tolerated   Pertinent Vitals/Pain C/o pain with movement. Unable to rate pain.     Mobility  Bed Mobility Bed Mobility: Sit to Supine;Rolling Left Rolling Left: 1: +1 Total assist;With rail Sit to Supine: 1: +2 Total assist;HOB flat Sit to Supine: Patient Percentage: 0% Details for Bed Mobility Assistance: Assist for all aspects of sit to supine transition.  Cues for use of rail, bending R hip/knee and trunk rotation for transition to L sidelying.   Transfers Transfers: Sit to Stand;Stand to Sit;Stand Pivot Transfers Sit to Stand: 1: +2 Total assist;From bed;With upper extremity assist Sit to Stand: Patient Percentage: 20% Stand to Sit: 1: +2 Total assist;To bed;To chair/3-in-1 Stand to Sit: Patient Percentage: 10% Stand Pivot Transfers: 1: +2 Total assist Stand Pivot Transfers: Patient Percentage: 10% Details for Transfer Assistance: Assist to manage pt's body weight.  total assist to pivot to bed a pt unable to engage either LE.   Ambulation/Gait Ambulation/Gait Assistance: Not tested (comment)    Exercises     PT Diagnosis:    PT Problem List:   PT Treatment Interventions:     PT Goals Acute Rehab PT Goals PT Goal  Formulation: With patient/family Time For Goal Achievement: 05/29/12 Potential to Achieve Goals: Fair Pt will go Supine/Side to Sit: with mod assist PT Goal: Supine/Side to Sit - Progress: Goal set today Pt will go Sit to Supine/Side: with mod assist;with HOB 0 degrees PT Goal: Sit to Supine/Side - Progress: Goal set today Pt will go Sit to Stand: with mod assist PT Goal: Sit to Stand - Progress: Goal set today Pt will go Stand to Sit: with mod assist PT Goal: Stand to Sit - Progress: Goal set today Pt will Transfer Bed to Chair/Chair to Bed: with mod assist PT Transfer Goal: Bed to Chair/Chair to Bed - Progress: Goal set today  Visit Information  Last PT Received On: 05/15/12 Assistance Needed: +2    Subjective Data      Cognition  Overall Cognitive Status: Appears within functional limits for tasks assessed/performed Arousal/Alertness: Lethargic Orientation Level: Appears intact for tasks assessed Behavior During Session: [facility] for tasks performed    Balance  Balance Balance Assessed: No  End of Session PT - End of Session Equipment Utilized During Treatment: Gait belt Activity Tolerance: Patient limited by pain Patient left: in bed;with call bell/phone within reach;with bed alarm set Nurse Communication: Mobility status;Need for lift equipment;Weight bearing status;Precautions   GP     Mahrosh Donnell 05/15/2012, 3:40 PM Lynett Brasil L. Concetta Guion DPT [phone]

## 2012-05-15 NOTE — Progress Notes (Signed)
Clinical Social Work Department BRIEF PSYCHOSOCIAL ASSESSMENT 05/15/2012  Patient:  [name], [name]     Account Number:  [id number]     Admit date:  05/13/2012  Clinical Social Worker:  Dennison Bulla  Date/Time:  05/15/2012 03:00 PM  Referred by:  Physician  Date Referred:  05/15/2012 Referred for  SNF Placement   Other Referral:   Interview type:  Family Other interview type:    PSYCHOSOCIAL DATA Living Status:  FACILITY Admitted from facility:  OTHER Level of care:  Assisted Living Primary support name:  Bonita Quin Primary support relationship to patient:  CHILD, ADULT Degree of support available:   Strong    CURRENT CONCERNS Current Concerns  Post-Acute Placement   Other Concerns:    SOCIAL WORK ASSESSMENT / PLAN CSW received referral due to patient needing SNF placement at dc. CSW reviewed chart and met with patient and family at bedside. Dtr-in-law present but requests that CSW speak with patient's dtr.    CSW spoke with dtr Bonita Quin) via phone. CSW introduced myself and explained role. Dtr reports that patient has been at [facility] for about 5 years. Patient had pneumonia about 2 years ago and stayed at [facility] for about 5 days. CSW explained SNF need again and dtr agreeable. Dtr prefers search in [place] at this time. CSW left SNF list in room and explained insurance approval would need to be obtained prior to discharge.    CSW completed FL2 and faxed out. CSW submitted clinicals to [facility] for authorization.   Assessment/plan status:  Psychosocial Support/Ongoing Assessment of Needs Other assessment/ plan:   Information/referral to community resources:   SNF list    PATIENT'S/FAMILY'S RESPONSE TO PLAN OF CARE: Patient unable to participate in assessment at this time. Dtr engaged throughout assessment and agreeable to CSW consult. Dtr thanked CSW for time.

## 2012-05-16 DIAGNOSIS — D649 Anemia, unspecified: Secondary | ICD-10-CM

## 2012-05-16 LAB — CBC
HCT: 22.5 % — ABNORMAL LOW (ref 36.0–46.0)
Hemoglobin: 7.4 g/dL — ABNORMAL LOW (ref 12.0–15.0)
MCH: 30.3 pg (ref 26.0–34.0)
MCHC: 32.9 g/dL (ref 30.0–36.0)
MCV: 92.2 fL (ref 78.0–100.0)

## 2012-05-16 LAB — BASIC METABOLIC PANEL
BUN: 39 mg/dL — ABNORMAL HIGH (ref 6–23)
Creatinine, Ser: 1.66 mg/dL — ABNORMAL HIGH (ref 0.50–1.10)
GFR calc non Af Amer: 25 mL/min — ABNORMAL LOW (ref >90)
Glucose, Bld: 112 mg/dL — ABNORMAL HIGH (ref 70–99)
Potassium: 4.2 mEq/L (ref 3.5–5.1)

## 2012-05-16 LAB — PREPARE RBC (CROSSMATCH)

## 2012-05-16 MED ORDER — FUROSEMIDE 10 MG/ML IJ SOLN
20.0000 mg | Freq: Once | INTRAMUSCULAR | Status: AC
  Administered 2012-05-16: 20 mg via INTRAVENOUS
  Filled 2012-05-16: qty 2

## 2012-05-16 NOTE — Progress Notes (Signed)
On call MD notified about pt's heart rate which drops to 38. Pt is asymptomatic and vital signs remain stable. Sats remain in high 90's. No orders received.

## 2012-05-16 NOTE — Progress Notes (Signed)
PATIENT ID: [name]  MRN: [id number]  DOB/AGE:  07-05-1914 / 79 y.o.  2 Days Post-Op Procedure(s) (LRB): ARTHROPLASTY BIPOLAR HIP (Left)    PROGRESS NOTE Subjective: Patient is resting comfortably this morning.  She is arousable and can follow commands. Denies SOB, Chest or Calf Pain.  Hip pain controlled.  Objective: Vital signs in last 24 hours: Filed Vitals:   05/16/12 0000 05/16/12 0100 05/16/12 0400 05/16/12 0650  BP:    122/35  Pulse:  88  75  Temp:    98 F (36.7 C)  TempSrc:      Resp: 16  16 16   Height:      Weight:      SpO2: 98% 98% 98% 100%      Intake/Output from previous day: I/O last 3 completed shifts: In: 850 [P.O.:170; I.V.:200; Other:480] Out: 850 [Urine:850]   Intake/Output this shift:     LABORATORY DATA:  Basename 05/16/12 0645 05/15/12 0615 05/13/12 1507  WBC 7.5 7.9 --  HGB 7.4* 9.5* --  HCT 22.5* 28.4* --  PLT 112* 125* --  NA 136 137 --  K 4.2 4.7 --  CL 103 103 --  CO2 26 24 --  BUN 39* 29* --  CREATININE 1.66* 1.49* --  GLUCOSE 112* 114* --  GLUCAP -- -- --  INR -- -- 1.06  CALCIUM 8.2* -- --    Examination: Neurologically intact ABD soft Neurovascular intact Sensation intact distally Intact pulses distally Dorsiflexion/Plantar flexion intact Incision: dressing C/D/I} XR AP&Lat of hip shows well placed\fixed THA  Assessment:   2 Days Post-Op Procedure(s) (LRB): ARTHROPLASTY BIPOLAR HIP (Left) ADDITIONAL DIAGNOSIS:  Acute Blood Loss Anemia - will defer transfusion to hospitalists due to patient's baseline cardiac status  Plan: PT/OT WBAT, THA  anterior precautions  DVT Prophylaxis: SCDx72 hrs, ASA 325 mg BID x 2 weeks  DISCHARGE PLAN: Skilled Nursing Facility/Rehab   DISCHARGE NEEDS: HHPT, HHRN, Walker and 3-in-1 comode seat

## 2012-05-16 NOTE — Progress Notes (Signed)
[facility] PROGRESS NOTE  [name] ZOX:[id number] DOB: [birth year] DOA: 05/13/2012 PCP: Gina Pounds, MD  Assessment/Plan: Active Problems:  Fall  Closed left hip fracture  Aortic stenosis  Hypothyroidism  Urolithiasis  History of CVA (cerebrovascular accident)  Dementia    1. Fall: Patient presented following what appears to have been a mechanical fall, although precise circumstances are unclear at this time. This was complicated by a left hip fracture. See below.  2. Closed left hip fracture: This is secondary to #1. Patient was ambulant prior to fall, but was clearly at very high risk for surgical intervention, given known history of severe aortic stenosis.  Dr Rollene Rotunda provided cardiology consultation, and opined that patient has mild LV dysfunction and severe/critical aortic stenosis, not amenable to mitigation with any medical or interventional therapy at this point, and is at high risk for repair based on her age, frailty and AS. Family opted to proceed with high risk surgery as quality of life is paramount in their minds. Dr Mack Hook provided orthopedic consultation, and patient underwent left hip hemiarthroplasty on 05/14/12. Now POD#2 and patient appears stable. [name] today. Managing per orthopedics. For PT/OT/Rehab.  3. Anemia: HB at presentation was 12.8, and post-operatively, has dropped to 9.5 as of 05/15/12. This is ABLA, due to surgery. Following CBC, and HB is7.4 today. Will transfuse 1 unit PRBC today.  4. Aortic stenosis: Patient has known severe aortic stenosis, with valvular area of 0.72, when evaluated by Dr Arvilla Meres in 2012. At presentation, she had no symptoms of chest pain or overt CHF. She remains stable clinically. Close attention is being paid to volume status.  5. Dehydration: Patient appeared clinically mildly dehydrated at presentation, although lab work was not immediately available at that time. Commenced on gentle iv fluids  initially, but have discontinued this on 05/16/12. Encourage oral fluids.  6. Hypothyroidism: Continued on thyroxine replacement therapy, initially iv, but will transitioned to oral formulation on 05/15/12.  7. Urolithiasis: Per history. This does not appear problematic at this time. Urinalysis is negative for UTI. Urine culture grew multiple morphotypes, suggestive of contamination. Patient is afebrile and wcc is normal. 8. History of CVA (cerebrovascular accident): Stable.  9. Dementia: Known dementia, with episodes of sun-downing. Stable. Will maintain fall precautions.    Code Status: DNR/DNI Family Communication:  Disposition Plan: To be determined.    Brief narrative: 79 year old female resident of [place] [facility] [place], ALF in [place], with known history of Hypothyroidism, Glaucoma, open angle, Hyperlipidemia, Kidney stones, Macular degeneration, Osteoarthritis, s/p left internal capsule CVA 05/2003, severe aortic stenosis (Valve area 0.72) and aortic insufficiency, s/p benign breast biopsy/lumpectomy, s/p cholecystectomy, s/p cataract surgery/intraocular lens implants, dementia with sun-downing, transferred from [facility], per family request, following a fall. Precise circumstances are unclear, but patient's daughter [name] (Tel: [phone]) was contacted at 4:00 AM on 05/13/12, by staff at ALF, to inform her that patient had fallen, while in the bathroom. She was taken to [facility], where imaging studies revealed a left femoral neck fracture.   Consultants:  Dr Emerson Monte, orthopedic surgeon.  Dr Rollene Rotunda, cardiologist.   Procedures:  X-ray left femur.  X-Ray Hip.  CXR.   Antibiotics:  N/A.   HPI/Subjective: No new issues.   Objective: Vital signs in last 24 hours: Temp:  [98 F (36.7 C)-99.7 F (37.6 C)] 98 F (36.7 C) (02/02 0650) Pulse Rate:  [38-95] 75  (02/02 0650) Resp:  [16-18]  16  (02/02 0800) BP: (101-122)/(28-41)  122/35 mmHg (02/02 0650) SpO2:  [95 %-100 %] 100 % (02/02 0800) Weight change:  Last BM Date: 05/12/12  Intake/Output from previous day: 02/01 0701 - 02/02 0700 In: 850 [P.O.:170; I.V.:200] Out: 600 [Urine:600]     Physical Exam: General: Alert, albeit somewhat disoriented, not in acute discomfort, not short of breath at rest. Following simple commands accurately.  HEENT: Moderate clinical pallor, no jaundice, no conjunctival injection or discharge.  NECK: Supple, JVP not seen, no carotid bruits, no palpable lymphadenopathy, no palpable goiter.  CHEST: Clinically clear to auscultation, no wheezes, no crackles.  HEART: Sounds 1 and 2 heard, normal, regular, high-pitched late systolic murmur.  ABDOMEN: Full, soft, non-tender, no palpable organomegaly, no palpable masses, normal bowel sounds.  GENITALIA: Not examined.  LOWER EXTREMITIES: No pitting edema, palpable peripheral pulses.  MUSCULOSKELETAL SYSTEM: Surgical site appears clean and dry.  CENTRAL NERVOUS SYSTEM: No focal neurologic deficit on gross examination.  Lab Results:  Basename 05/16/12 0645 05/15/12 0615  WBC 7.5 7.9  HGB 7.4* 9.5*  HCT 22.5* 28.4*  PLT 112* 125*    Basename 05/16/12 0645 05/15/12 0615  NA 136 137  K 4.2 4.7  CL 103 103  CO2 26 24  GLUCOSE 112* 114*  BUN 39* 29*  CREATININE 1.66* 1.49*  CALCIUM 8.2* 8.2*   Recent Results (from the past 240 hour(s))  URINE CULTURE     Status: Normal   Collection Time   05/13/12  4:34 PM      Component Value Range Status Comment   Specimen Description URINE, CLEAN CATCH   Final    Special Requests NONE   Final    Culture  Setup Time 05/13/2012 17:54   Final    Colony Count 50,000 COLONIES/ML   Final    Culture     Final    Value: Multiple bacterial morphotypes present, none predominant. Suggest appropriate recollection if clinically indicated.   Report Status 05/14/2012 FINAL   Final   MRSA PCR SCREENING     Status: Normal   Collection Time   05/13/12   4:35 PM      Component Value Range Status Comment   MRSA by PCR NEGATIVE  NEGATIVE Final      Studies/Results: Dg Pelvis Portable  05/14/2012  *RADIOLOGY REPORT*  Clinical Data: Post left hip arthroplasty  PORTABLE PELVIS  Comparison: Left hip radiographs - 05/13/2012  Findings:  Post left hip hemiarthroplasty. Alignment appears near anatomic on this AP projection radiograph.  No definite fracture.  There is a minimal amount of expected subcutaneous emphysema and soft tissue stranding about the operative site.  Skin staples are seen about the lateral aspect of the thigh.  No radiopaque foreign body.  IMPRESSION: Post left hip hemiarthroplasty without definite evidence of complication.   Original Report Authenticated By: Tacey Ruiz, MD     Medications: Scheduled Meds:    . aspirin EC  325 mg Oral Q breakfast  . calcitonin (salmon)  1 spray Alternating Nares Daily  . cholecalciferol  2,000 Units Oral Daily  . docusate sodium  100 mg Oral BID  . ferrous sulfate  325 mg Oral Q breakfast  . levothyroxine  112 mcg Oral QHS  . multivitamin with minerals  1 tablet Oral Daily  . senna  1 tablet Oral QHS  . sertraline  25 mg Oral Daily  . timolol  1 drop Both Eyes QHS  . Travoprost (BAK Free)  1 drop Both  Eyes QHS   Continuous Infusions:  PRN Meds:.acetaminophen, acetaminophen, HYDROcodone-acetaminophen, magnesium hydroxide, menthol-cetylpyridinium, metoCLOPramide (REGLAN) injection, metoCLOPramide, morphine injection, ondansetron (ZOFRAN) IV, ondansetron, phenol    LOS: 3 days   Jannetta Massey,CHRISTOPHER  [facility] Pager [phone]. If 8PM-8AM, please contact night-coverage at [email or web address], password [facility] 05/16/2012, 11:40 AM  LOS: 3 days

## 2012-05-17 ENCOUNTER — Inpatient Hospital Stay (HOSPITAL_COMMUNITY): Payer: Medicare Other

## 2012-05-17 LAB — BASIC METABOLIC PANEL
CO2: 28 mEq/L (ref 19–32)
CO2: 26 mEq/L (ref 19–32)
Calcium: 8.3 mg/dL — ABNORMAL LOW (ref 8.4–10.5)
Creatinine, Ser: 1.31 mg/dL — ABNORMAL HIGH (ref 0.50–1.10)
Glucose, Bld: 106 mg/dL — ABNORMAL HIGH (ref 70–99)
Glucose, Bld: 101 mg/dL — ABNORMAL HIGH (ref 70–99)
Potassium: 4.5 mEq/L (ref 3.5–5.1)
Sodium: 138 mEq/L (ref 135–145)

## 2012-05-17 LAB — CBC
HCT: 26.7 % — ABNORMAL LOW (ref 36.0–46.0)
Hemoglobin: 9.2 g/dL — ABNORMAL LOW (ref 12.0–15.0)
Hemoglobin: 9 g/dL — ABNORMAL LOW (ref 12.0–15.0)
MCH: 30.9 pg (ref 26.0–34.0)
MCV: 91.8 fL (ref 78.0–100.0)
RBC: 2.98 MIL/uL — ABNORMAL LOW (ref 3.87–5.11)
RBC: 2.91 MIL/uL — ABNORMAL LOW (ref 3.87–5.11)

## 2012-05-17 LAB — TYPE AND SCREEN

## 2012-05-17 MED ORDER — SODIUM CHLORIDE 0.9 % IV SOLN: INTRAVENOUS | Status: AC

## 2012-05-17 NOTE — Evaluation (Signed)
Clinical/Bedside Swallow Evaluation Patient Details  Name: [name] MRN: [id number] Date of Birth: 1914/09/07  Today's Date: 05/17/2012 Time: 4403-4742 SLP Time Calculation (min): 28 min  Past Medical History:  Past Medical History  Diagnosis Date  . Aortic stenosis, severe   . Hypothyroid   . Macular degeneration   . Osteoarthritis (arthritis due to wear and tear of joints)   . Nephrolithiasis   . CVA (cerebral infarction) 2005    minor  . Hyperlipidemia   . Open angle primary glaucoma   . Dementia     Hx sun-downing   Past Surgical History:  Past Surgical History  Procedure Date  . Cholecystectomy   . Cataract extraction   . Breast lumpectomy    HPI:  This is a 79 yo female resident of [address] of [place], ALF in [place], with known h/o Hypothyroidism, Glaucoma, open angle, Hyperlipidemia, Kidney stones, Macular degeneration, Osteoarthritis, s/p left internal capsule CVA 05/2003, severe aortic stenosis and aortic insufficiency, s/p benign breast biopsy/lumpectomy, s/p cholecystectomy, s/p cataract surgery/intraocular lens implants,dementia with sun-downing, transferred from [facility], per family request, following a fall.  Imaging studies revealed a left femoral neck fracture. RN reports that she is grimacing every time she swallows as if it may be painful to do so.   Assessment / Plan / Recommendation Clinical Impression  Pt presents with signs of an oropharyngeal dysphagia marked by bilateral pocketing, oral residue with solids, and decreased hyolaryngeal movement. Pt was able to use a lingual sweep with maximum cues to clear oral residue with pureed solids, however was unable to manipulate and posteriorly propel mechanical soft solids, ultimately requiring removal of bolus from the oral cavity by SLP. Facial grimace with swallow as described by RN was observed x1. Despite signs of dysphagia as noted above, no overt s/sx of aspiration/penetration were observed.  Recommend to initiate a trial of dys 1 (puree) diet to facilitate oral phase, along with thin liquids. Recommend full supervision to check for oral pocketing and cue for small bites/sips. SLP to f/u to assess diet tolerance.    Aspiration Risk  Mild    Diet Recommendation Dysphagia 1 (Puree);Thin liquid   Liquid Administration via: Cup Medication Administration: Crushed with puree Supervision: Patient able to self feed;Full supervision/cueing for compensatory strategies Compensations: Slow rate;Small sips/bites;Check for pocketing;Follow solids with liquid Postural Changes and/or Swallow Maneuvers: Seated upright 90 degrees;Upright 30-60 min after meal    Other  Recommendations Oral Care Recommendations: Oral care BID;Oral care before and after PO   Follow Up Recommendations  Skilled Nursing facility    Frequency and Duration min 2x/week  2 weeks   Pertinent Vitals/Pain N/A    SLP Swallow Goals Patient will consume recommended diet without observed clinical signs of aspiration with: Moderate cueing Swallow Study Goal #1 - Progress: Other (comment) (Goal set 05/17/12) Patient will utilize recommended strategies during swallow to increase swallowing safety with: Moderate cueing Swallow Study Goal #2 - Progress: Other (comment) (05/17/12)   Swallow Study Prior Functional Status  Type of Home: Assisted living Lives With: Other (Comment) (ALF) Available Help at Discharge: Skilled Nursing Facility Vocation: Retired    General Date of Onset:  (unknown) HPI: This is a 79 yo female resident of [place] [facility] [place], ALF in [place], with known h/o Hypothyroidism, Glaucoma, open angle, Hyperlipidemia, Kidney stones, Macular degeneration, Osteoarthritis, s/p left internal capsule CVA 05/2003, severe aortic stenosis and aortic insufficiency, s/p benign breast biopsy/lumpectomy, s/p cholecystectomy, s/p cataract surgery/intraocular lens implants,dementia with sun-downing, transferred  from [facility], per family request, following a fall.  Imaging studies revealed a left femoral neck fracture. RN reports that she is grimacing every time she swallows as if it may be painful to do so. Type of Study: Bedside swallow evaluation Diet Prior to this Study: Regular;Thin liquids Temperature Spikes Noted: No Respiratory Status: Supplemental O2 delivered via (comment) (2L via [place]) History of Recent Intubation: No Behavior/Cognition: Alert;Requires cueing Oral Cavity - Dentition: Dentures, top;Missing dentition Self-Feeding Abilities: Able to feed self;Needs assist Patient Positioning: Upright in chair Baseline Vocal Quality: Clear;Other (comment) (minimal vocalizations throughout assessment) Volitional Cough: Weak Volitional Swallow: Able to elicit    Oral/Motor/Sensory Function Overall Oral Motor/Sensory Function: Other (comment) (unsure of baseline function, however s/p CVA (2005)) Labial ROM: Within Functional Limits Labial Symmetry: Within Functional Limits Labial Strength: Reduced Labial Sensation: Within Functional Limits Lingual ROM: Within Functional Limits Lingual Symmetry: Within Functional Limits Lingual Strength: Reduced Facial ROM: Within Functional Limits Facial Symmetry: Within Functional Limits Velum: Within Functional Limits Mandible: Within Functional Limits   Ice Chips Ice chips: Within functional limits Presentation: Spoon   Thin Liquid Thin Liquid: Impaired Presentation: Cup;Self Fed;Straw Pharyngeal  Phase Impairments: Decreased hyoid-laryngeal movement;Multiple swallows    Nectar Thick Nectar Thick Liquid: Not tested   Honey Thick Honey Thick Liquid: Not tested   Puree Puree: Impaired Presentation: Spoon Oral Phase Impairments: Reduced lingual movement/coordination;Impaired anterior to posterior transit;Poor awareness of bolus Oral Phase Functional Implications: Right lateral sulci pocketing;Left lateral sulci pocketing;Prolonged oral transit;Oral  residue (residue cleared with cue to sweep with tongue) Pharyngeal Phase Impairments: Decreased hyoid-laryngeal movement;Multiple swallows   Solid   GO    Solid: Impaired Presentation: Spoon Oral Phase Impairments: Reduced lingual movement/coordination;Impaired anterior to posterior transit;Poor awareness of bolus Oral Phase Functional Implications: Right lateral sulci pocketing;Left lateral sulci pocketing;Oral residue Pharyngeal Phase Impairments: Other (comments) (no swallow elicited - bolus removed by SLP)      Maxcine Ham, M.A. CF-SLP  Maxcine Ham 05/17/2012,3:50 PM

## 2012-05-17 NOTE — Progress Notes (Signed)
[facility] PROGRESS NOTE  [name] FAO:[id number] DOB: [birth year] DOA: 05/13/2012 PCP: Gwen Pounds, MD  Assessment/Plan: Active Problems:  Fall  Closed left hip fracture  Aortic stenosis  Hypothyroidism  Urolithiasis  History of CVA (cerebrovascular accident)  Dementia  Anemia    1. Fall: Patient presented following what appears to have been a mechanical fall, although precise circumstances are unclear at this time. This was complicated by a left hip fracture. See below.  2. Closed left hip fracture: This is secondary to #1. Patient was ambulant prior to fall, but was clearly at very high risk for surgical intervention, given known history of severe aortic stenosis.  Dr Rollene Rotunda provided cardiology consultation, and opined that patient has mild LV dysfunction and severe/critical aortic stenosis, not amenable to mitigation with any medical or interventional therapy at this point, and is at high risk for repair based on her age, frailty and AS. Family opted to proceed with high risk surgery as quality of life is paramount in their minds. Dr Mack Hook provided orthopedic consultation, and patient underwent left hip hemiarthroplasty on 05/14/12. Now POD#3 and patient appears stable. [name]. Managing per orthopedics. For PT/OT/Rehab at [facility].  3. Anemia: HB at presentation was 12.8, and post-operatively, has dropped to 9.5 as of 05/15/12. This is ABLA, due to surgery. Following CBC, and HB was 7.4 on 05/16/12. Transfused 1 unit PRBC on that date, wigh satisfactory bump in HB to 9.2.   4. Aortic stenosis: Patient has known severe aortic stenosis, with valvular area of 0.72, when evaluated by Dr Arvilla Meres in 2012. At presentation, she had no symptoms of chest pain or overt CHF. She remains stable clinically. Close attention is being paid to volume status.  5. Dehydration: Patient appeared clinically mildly dehydrated at presentation, although lab work was not  immediately available at that time. Commenced on gentle iv fluids. Encouraging oral fluids., but intake is poor. Have requested SLP evaluation.  6. Hypothyroidism: Continued on thyroxine replacement therapy, initially iv, but transitioned to oral formulation on 05/15/12.  7. Urolithiasis: Per history. This does not appear problematic at this time. Urinalysis is negative for UTI. Urine culture grew multiple morphotypes, suggestive of contamination. Patient is afebrile and wcc is normal. 8. History of CVA (cerebrovascular accident): Stable.  9. Dementia: Known dementia, with episodes of sun-downing. Stable. Will maintain fall precautions.    Code Status: DNR/DNI Family Communication:  Disposition Plan: To be determined.    Brief narrative: 79 year old female resident of [place] [facility] [place], ALF in [place], with known history of Hypothyroidism, Glaucoma, open angle, Hyperlipidemia, Kidney stones, Macular degeneration, Osteoarthritis, s/p left internal capsule CVA 05/2003, severe aortic stenosis (Valve area 0.72) and aortic insufficiency, s/p benign breast biopsy/lumpectomy, s/p cholecystectomy, s/p cataract surgery/intraocular lens implants, dementia with sun-downing, transferred from [facility], per family request, following a fall. Precise circumstances are unclear, but patient's daughter [name] (Tel: [phone]) was contacted at 4:00 AM on 05/13/12, by staff at ALF, to inform her that patient had fallen, while in the bathroom. She was taken to [facility], where imaging studies revealed a left femoral neck fracture.   Consultants:  Dr Emerson Monte, orthopedic surgeon.  Dr Rollene Rotunda, cardiologist.   Procedures:  X-ray left femur.  X-Ray Hip.  CXR.   Antibiotics:  N/A.   HPI/Subjective: No new issues.   Objective: Vital signs in last 24 hours: Temp:  [97.1 F (36.2 C)-98.4 F (36.9 C)] 97.4 F (36.3  C) (02/03 0750) Pulse Rate:  [63-78] 63   (02/03 0750) Resp:  [16-20] 16  (02/03 0750) BP: (96-127)/(33-56) 127/53 mmHg (02/03 0750) SpO2:  [95 %-100 %] 100 % (02/03 0750) FiO2 (%):  [2 %] 2 % (02/02 1342) Weight change:  Last BM Date: 05/12/12  Intake/Output from previous day: 02/02 0701 - 02/03 0700 In: 890 [P.O.:480; I.V.:410] Out: 650 [Urine:650] Total I/O In: 120 [P.O.:120] Out: 450 [Urine:450]   Physical Exam: General: Sitting in chair, alert, not in acute discomfort, not short of breath at rest. Following simple commands accurately.  HEENT: Mild clinical pallor, no jaundice, no conjunctival injection or discharge.  NECK: Supple, JVP not seen, no carotid bruits, no palpable lymphadenopathy, no palpable goiter.  CHEST: Clinically clear to auscultation, no wheezes, no crackles.  HEART: Sounds 1 and 2 heard, normal, regular, high-pitched late systolic murmur.  ABDOMEN: Full, soft, non-tender, no palpable organomegaly, no palpable masses, normal bowel sounds.  GENITALIA: Not examined.  LOWER EXTREMITIES: No pitting edema, palpable peripheral pulses.  MUSCULOSKELETAL SYSTEM: Surgical site appears clean and dry.  CENTRAL NERVOUS SYSTEM: No focal neurologic deficit on gross examination.  Lab Results:  [facility] 05/17/12 1122 05/16/12 0645  WBC 8.1 7.5  HGB 9.2* 7.4*  HCT 27.4* 22.5*  PLT 124* 112*    Basename 05/16/12 0645 05/15/12 0615  NA 136 137  K 4.2 4.7  CL 103 103  CO2 26 24  GLUCOSE 112* 114*  BUN 39* 29*  CREATININE 1.66* 1.49*  CALCIUM 8.2* 8.2*   Recent Results (from the past 240 hour(s))  URINE CULTURE     Status: Normal   Collection Time   05/13/12  4:34 PM      Component Value Range Status Comment   Specimen Description URINE, CLEAN CATCH   Final    Special Requests NONE   Final    Culture  Setup Time 05/13/2012 17:54   Final    Colony Count 50,000 COLONIES/ML   Final    Culture     Final    Value: Multiple bacterial morphotypes present, none predominant. Suggest appropriate recollection  if clinically indicated.   Report Status 05/14/2012 FINAL   Final   MRSA PCR SCREENING     Status: Normal   Collection Time   05/13/12  4:35 PM      Component Value Range Status Comment   MRSA by PCR NEGATIVE  NEGATIVE Final      Studies/Results: No results found.  Medications: Scheduled Meds:    . aspirin EC  325 mg Oral Q breakfast  . calcitonin (salmon)  1 spray Alternating Nares Daily  . cholecalciferol  2,000 Units Oral Daily  . docusate sodium  100 mg Oral BID  . ferrous sulfate  325 mg Oral Q breakfast  . levothyroxine  112 mcg Oral QHS  . multivitamin with minerals  1 tablet Oral Daily  . senna  1 tablet Oral QHS  . sertraline  25 mg Oral Daily  . timolol  1 drop Both Eyes QHS  . Travoprost (BAK Free)  1 drop Both Eyes QHS   Continuous Infusions:    . sodium chloride     PRN Meds:.acetaminophen, acetaminophen, HYDROcodone-acetaminophen, magnesium hydroxide, menthol-cetylpyridinium, metoCLOPramide (REGLAN) injection, metoCLOPramide, morphine injection, ondansetron (ZOFRAN) IV, ondansetron, phenol    LOS: 4 days   Brookley Acosta,Gina  [facility] Pager [phone]. If 8PM-8AM, please contact night-coverage at [email or web address], password [facility] 05/17/2012, 11:51 AM  LOS: 4 days

## 2012-05-17 NOTE — Evaluation (Signed)
Occupational Therapy Evaluation Patient Details Name: [name] MRN: [id number] DOB: 1915-02-24 Today's Date: 05/17/2012 Time: 1012-1039 OT Time Calculation (min): 27 min  OT Assessment / Plan / Recommendation Clinical Impression  Pt demos decline in function with ADLs, strength, balance, safety and activity tolerance following L hip surgery. Pt would benefit from OT services to address these impairments to restore PLOF to return to ALF safely    OT Assessment  Patient needs continued OT Services    Follow Up Recommendations  SNF    Barriers to Discharge Decreased caregiver support Pt's ALF not able to provide level of care needed at this time  Equipment Recommendations  None recommended by OT    Recommendations for Other Services    Frequency  Min 2X/week    Precautions / Restrictions Precautions Precautions: Anterior Hip Precaution Comments: Pt unable to recall hip precautions Restrictions Weight Bearing Restrictions: Yes LLE Weight Bearing: Weight bearing as tolerated       ADL  Grooming: Performed;Wash/dry hands;Wash/dry face;Brushing hair;Supervision/safety;Set up Where Assessed - Grooming: Supported sitting Upper Body Bathing: Simulated;Maximal assistance Where Assessed - Upper Body Bathing: Supported sitting Lower Body Bathing: +1 Total assistance Where Assessed - Lower Body Bathing: Supported sitting Upper Body Dressing: Performed;Maximal assistance Where Assessed - Upper Body Dressing: Supported sitting Lower Body Dressing: +1 Total assistance Where Assessed - Lower Body Dressing: Supported sitting Toilet Transfer: Simulated;+2 Total assistance Toilet Transfer Method: Sit to stand;Stand pivot Toileting - Clothing Manipulation and Hygiene: +1 Total assistance    OT Diagnosis: Generalized weakness  OT Problem List: Decreased strength;Decreased safety awareness;Decreased knowledge of use of DME or AE;Decreased activity tolerance;Impaired balance (sitting and/or  standing);Pain;Decreased knowledge of precautions OT Treatment Interventions: Self-care/ADL training;Therapeutic activities;Therapeutic exercise;Neuromuscular education;DME and/or AE instruction;Patient/family education;Balance training   OT Goals Acute Rehab OT Goals OT Goal Formulation: With patient/family Time For Goal Achievement: 05/24/12 Potential to Achieve Goals: Fair ADL Goals Pt Will Perform Grooming: with set-up;Sitting, chair;Sitting, edge of bed;Unsupported ADL Goal: Grooming - Progress: Goal set today Pt Will Perform Upper Body Bathing: with mod assist;Sitting, chair;Sitting, edge of bed ADL Goal: Upper Body Bathing - Progress: Goal set today Pt Will Perform Lower Body Bathing: with max assist;Sitting, chair;Sitting, edge of bed;Unsupported ADL Goal: Lower Body Bathing - Progress: Goal set today Pt Will Perform Upper Body Dressing: with mod assist;Sitting, chair;Sitting, bed;Unsupported ADL Goal: Upper Body Dressing - Progress: Goal set today Pt Will Perform Lower Body Dressing: with max assist;Sitting, chair;Sitting, bed;Unsupported ADL Goal: Lower Body Dressing - Progress: Goal set today Pt Will Transfer to Toilet: with max assist;with mod assist;with DME;Grab bars ADL Goal: Toilet Transfer - Progress: Goal set today  Visit Information  Last OT Received On: 05/17/12 Assistance Needed: +2    Subjective Data  Subjective: " I am tired " Patient Stated Goal: To d/c to a SNF for rehab then back to ALF per pt's dtr   Prior Functioning     Home Living Lives With: Other (Comment) (ALF) Available Help at Discharge: Skilled Nursing Facility Type of Home: Assisted living Home Access: Level entry Home Layout: One level Bathroom Shower/Tub: Health visitor: Standard Bathroom Accessibility: Yes How Accessible: Accessible via walker;Accessible via wheelchair Prior Function Level of Independence: Independent Driving: No Vocation:  Retired Musician: No difficulties Dominant Hand: Right         Vision/Perception Vision - History Baseline Vision: Wears glasses all the time Patient Visual Report: No change from baseline Vision - Assessment Eye Alignment: Within Functional Limits Perception  Perception: Within Functional Limits (hard of hearing)   Cognition  Cognition Overall Cognitive Status: Appears within functional limits for tasks assessed/performed Arousal/Alertness: Awake/alert Orientation Level: Appears intact for tasks assessed Behavior During Session: [facility] for tasks performed Cognition - Other Comments: Pt HOH and requires cueing    Extremity/Trunk Assessment Right Upper Extremity Assessment RUE ROM/Strength/Tone: [facility] for tasks assessed Left Upper Extremity Assessment LUE ROM/Strength/Tone: WFL for tasks assessed     Mobility Bed Mobility Bed Mobility: Supine to Sit;Rolling Left;Sitting - Scoot to Edge of Bed Rolling Left: 1: +1 Total assist Supine to Sit: 1: +2 Total assist Supine to Sit: Patient Percentage: 0% Sitting - Scoot to Edge of Bed: 1: +2 Total assist Details for Bed Mobility Assistance: A for all aspects. Patient attempting to move LE but difficult due to increased pain Transfers Transfers: Sit to Stand;Stand to Sit Sit to Stand: 1: +2 Total assist;From bed Sit to Stand: Patient Percentage: 30% Stand to Sit: To chair/3-in-1;1: +2 Total assist Stand to Sit: Patient Percentage: 30% Details for Transfer Assistance: Patient attempting to help more with stand by putting increased weight through LEs. Still unable to position or advance LEs with pivot.      Exercise Total Joint Exercises Ankle Circles/Pumps: PROM;Both;10 reps Heel Slides: PROM;Left;5 reps (Patient resistive)   Balance Balance Balance Assessed: No   End of Session OT - End of Session Equipment Utilized During Treatment: Gait belt Activity Tolerance: Patient limited by fatigue Patient left: in  chair;with call bell/phone within reach;with family/visitor present  GO     Galen Manila 05/17/2012, 12:32 PM

## 2012-05-17 NOTE — Progress Notes (Signed)
Foley catheter not removed until more information is available. Pt came in with foley prior to admission. Has not gotten up with physical therapy and is unable to move. Received a unit of RBC's and lasix and is requiring strict I&O's. Day shift RN  will speak to MD and family to determine if catheter is required beyond today.

## 2012-05-17 NOTE — Progress Notes (Signed)
PATIENT ID: [name]  MRN: [id number]  DOB/AGE:  1914-06-11 / 79 y.o.  3 Days Post-Op Procedure(s) (LRB): ARTHROPLASTY BIPOLAR HIP (Left)    PROGRESS NOTE Subjective: Patient is resting comfortably this morning.  She is arousable and can follow commands with motor response--won't verbally answer my questions however. Hip pain controlled. Rec'd 1unit PRBC yesterday  Objective: Vital signs in last 24 hours: Filed Vitals:   05/16/12 2327 05/17/12 0004 05/17/12 0400 05/17/12 0750  BP:    127/53  Pulse:  67  63  Temp:    97.4 F (36.3 C)  TempSrc:      Resp:  16 16 16   Height:      Weight:      SpO2: 98% 95% 98% 100%      Intake/Output from previous day: I/O last 3 completed shifts: In: 1700 [P.O.:650; I.V.:570; Other:480] Out: 1250 [Urine:1250]   Intake/Output this shift: Total I/O In: 120 [P.O.:120] Out: 450 [Urine:450]   LABORATORY DATA:  Basename 05/16/12 0645 05/15/12 0615  WBC 7.5 7.9  HGB 7.4* 9.5*  HCT 22.5* 28.4*  PLT 112* 125*  NA 136 137  K 4.2 4.7  CL 103 103  CO2 26 24  BUN 39* 29*  CREATININE 1.66* 1.49*  GLUCOSE 112* 114*  GLUCAP -- --  INR -- --  CALCIUM 8.2* --   This AM HGB not available yet  Examination: F/E toes/ankel with sole stroke Dressing with some sanguinous soakage, mostly dry  Assessment:   3 Days Post-Op Procedure(s) (LRB): ARTHROPLASTY BIPOLAR HIP (Left) ADDITIONAL DIAGNOSIS:  Acute Blood Loss Anemia - will defer transfusion to hospitalists due to patient's baseline cardiac status  Plan: PT/OT WBAT, THA  anterior precautions  DVT Prophylaxis: SCD in-patient, ASA 325 mg daily   DISCHARGE PLAN: Skilled Nursing Facility/Rehab --may d/c once hospitalists deem medically stable and SNF bed ready.  RTC to see me 2 weeks postop  DISCHARGE NEEDS: HHPT, HHRN, Walker and 3-in-1 comode seat

## 2012-05-17 NOTE — Progress Notes (Signed)
Physical Therapy Treatment Patient Details Name: [name] MRN: [id number] DOB: 21-Sep-1914 Today's Date: 05/17/2012 Time: 1012-1042 PT Time Calculation (min): 30 min  PT Assessment / Plan / Recommendation Comments on Treatment Session  Patient able to transfer slightly better today however still require heavy +2 for bed to recliner. Patients daughter present throughout and educated on mobility. Patients daughter to meet with CSW about SNF in [place] as that will be more helpful for family assistance    Follow Up Recommendations  SNF;Supervision/Assistance - 24 hour     Does the patient have the potential to tolerate intense rehabilitation     Barriers to Discharge        Equipment Recommendations  None recommended by PT    Recommendations for Other Services    Frequency Min 3X/week   Plan Discharge plan remains appropriate;Frequency remains appropriate    Precautions / Restrictions Precautions Precautions: Anterior Hip Precaution Comments: Patient unable to recall precautions. A to maintain throughout Restrictions LLE Weight Bearing: Weight bearing as tolerated   Pertinent Vitals/Pain     Mobility  Bed Mobility Supine to Sit: 1: +2 Total assist Supine to Sit: Patient Percentage: 0% Sitting - Scoot to Edge of Bed: 1: +1 Total assist Details for Bed Mobility Assistance: A for all aspects. Patient attempting to move LE but difficult due to increased pain Transfers Sit to Stand: 1: +2 Total assist;From bed Sit to Stand: Patient Percentage: 30% Stand to Sit: 1: +2 Total assist Stand to Sit: Patient Percentage: 30% Stand Pivot Transfers: 1: +2 Total assist Details for Transfer Assistance: Patient attempting to help more with stand by putting increased weight through LEs. Still unable to position or advance LEs with pivot.  Ambulation/Gait Ambulation/Gait Assistance: Not tested (comment)    Exercises Total Joint Exercises Ankle Circles/Pumps: PROM;Both;10 reps Heel  Slides: PROM;Left;5 reps (Patient resistive)   PT Diagnosis:    PT Problem List:   PT Treatment Interventions:     PT Goals Acute Rehab PT Goals PT Goal: Supine/Side to Sit - Progress: Progressing toward goal PT Goal: Sit to Supine/Side - Progress: Progressing toward goal PT Goal: Sit to Stand - Progress: Progressing toward goal PT Goal: Stand to Sit - Progress: Progressing toward goal PT Transfer Goal: Bed to Chair/Chair to Bed - Progress: Progressing toward goal  Visit Information  Last PT Received On: 05/17/12 Assistance Needed: +2    Subjective Data      Cognition  Cognition Overall Cognitive Status: Appears within functional limits for tasks assessed/performed Arousal/Alertness: Awake/alert Orientation Level: Appears intact for tasks assessed Behavior During Session: [facility] for tasks performed Cognition - Other Comments: Patient HOH but responses to cueing    Balance     End of Session PT - End of Session Equipment Utilized During Treatment: Gait belt Activity Tolerance: Patient limited by fatigue;Patient limited by pain Patient left: in chair;with call bell/phone within reach;with family/visitor present Nurse Communication: Mobility status   GP     Fredrich Birks 05/17/2012, 10:56 AM 05/17/2012 Fredrich Birks PTA [phone] pager [phone] office

## 2012-05-17 NOTE — Progress Notes (Signed)
Called re; CBC & BMP lab draw, none has been taken.  Called lab to place order for draw.  Will continue to monitor for status changes.

## 2012-05-17 NOTE — Plan of Care (Signed)
Problem: Phase II Progression Outcomes Goal: Discharge plan established Recommend SNF for further therapy needs after acute care d/c     

## 2012-05-18 ENCOUNTER — Encounter (HOSPITAL_COMMUNITY): Payer: Self-pay | Admitting: Orthopedic Surgery

## 2012-05-18 LAB — BASIC METABOLIC PANEL WITH GFR
BUN: 30 mg/dL — ABNORMAL HIGH (ref 6–23)
CO2: 23 meq/L (ref 19–32)
Calcium: 8.3 mg/dL — ABNORMAL LOW (ref 8.4–10.5)
Chloride: 103 meq/L (ref 96–112)
Creatinine, Ser: 1.06 mg/dL (ref 0.50–1.10)
GFR calc Af Amer: 50 mL/min — ABNORMAL LOW
GFR calc non Af Amer: 43 mL/min — ABNORMAL LOW
Glucose, Bld: 100 mg/dL — ABNORMAL HIGH (ref 70–99)
Potassium: 4.5 meq/L (ref 3.5–5.1)
Sodium: 137 meq/L (ref 135–145)

## 2012-05-18 LAB — CBC
MCH: 29.7 pg (ref 26.0–34.0)
MCV: 92 fL (ref 78.0–100.0)
Platelets: 134 10*3/uL — ABNORMAL LOW (ref 150–400)
RBC: 2.86 MIL/uL — ABNORMAL LOW (ref 3.87–5.11)
RDW: 13.7 % (ref 11.5–15.5)

## 2012-05-18 NOTE — Progress Notes (Addendum)
[facility] PROGRESS NOTE  SUJATA MAINES ZOX:[id number] DOB: 1915/03/08 DOA: 05/13/2012 PCP: Gwen Pounds, MD  Assessment/Plan: Active Problems:  Fall  Closed left hip fracture  Aortic stenosis  Hypothyroidism  Urolithiasis  History of CVA (cerebrovascular accident)  Dementia  Anemia    1. Fall: Patient presented following what appears to have been a mechanical fall, although precise circumstances are unclear at this time. This was complicated by a left hip fracture. See below.  2. Closed left hip fracture: This is secondary to #1. Patient was ambulant prior to fall, but was clearly at very high risk for surgical intervention, given known history of severe aortic stenosis. Dr Rollene Rotunda provided cardiology consultation, and opined that patient has mild LV dysfunction and severe/critical aortic stenosis, not amenable to mitigation with any medical or interventional therapy at this point, and therefore, at high risk for repair based on her age, frailty and AS. Family opted to proceed with high risk surgery as quality of life is paramount in their minds. Dr Mack Hook provided orthopedic consultation, and patient underwent left hip hemiarthroplasty on 05/14/12. Now POD#4 and patient appears stable. [name]. Managing per orthopedics. For PT/OT/[facility] at [facility].  3. Anemia: HB at presentation was 12.8, and post-operatively, dropped to 9.5 as of 05/15/12. This was ABLA, due to surgery. Following CBC, and HB was 7.4 on 05/16/12. Transfused 1 unit PRBC on that date, with satisfactory bump in HB to 9.2. HB has since remained stable. 4. Aortic stenosis: As described above, patient has known severe aortic stenosis, with valvular area of 0.72, when evaluated by Dr Arvilla Meres in 2012. At presentation, she had no symptoms of chest pain or overt CHF. She has remained stable clinically during her hosptalization, with close attention paid to volume status. CXR of 05/17/12, was stable, without  evidence of overt pulmonary edema.  5. Dehydration: Patient appeared clinically mildly dehydrated at presentation, although lab work was not immediately available at that time. Commenced on gentle iv fluids. Encouraging oral fluids, but intake is poor. SLP evaluated on 05/17/12, and has recommended D1/Thin liquids.   6. Hypothyroidism: Continued on thyroxine replacement therapy, initially iv, but transitioned to oral formulation on 05/15/12.  7. Urolithiasis: Per history. This does not appear problematic at this time. Urinalysis was negative for UTI. Urine culture grew multiple morphotypes, suggestive of contamination. Patient remained afebrile and wcc, normal. 8. History of CVA (cerebrovascular accident): Stable.  9. Dementia: Known dementia, with episodes of sun-downing. Stable.    Code Status: DNR/DNI Family Communication:  Disposition Plan: Stable for discharge to SNF in AM 05/19/12.    Brief narrative: 79 year old female resident of [facility], ALF in [place], with known history of Hypothyroidism, Glaucoma, open angle, Hyperlipidemia, Kidney stones, Macular degeneration, Osteoarthritis, s/p left internal capsule CVA 05/2003, severe aortic stenosis (Valve area 0.72) and aortic insufficiency, s/p benign breast biopsy/lumpectomy, s/p cholecystectomy, s/p cataract surgery/intraocular lens implants, dementia with sun-downing, transferred from [facility], per family request, following a fall. Precise circumstances are unclear, but patient's daughter [name] (Tel: [phone]) was contacted at 4:00 AM on 05/13/12, by staff at ALF, to inform her that patient had fallen, while in the bathroom. She was taken to [facility], where imaging studies revealed a left femoral neck fracture.   Consultants:  Dr Emerson Monte, orthopedic surgeon.  Dr Rollene Rotunda, cardiologist.   Procedures:  X-ray left femur.  X-Ray Hip.  CXR.   Antibiotics:  N/A.  HPI/Subjective: No new issues.   Objective: Vital signs in last 24 hours: Temp:  [98 F (36.7 C)-98.4 F (36.9 C)] 98 F (36.7 C) (02/04 0630) Pulse Rate:  [68-70] 68  (02/04 0630) Resp:  [18-20] 20  (02/04 0630) BP: (126-152)/(38-58) 152/58 mmHg (02/04 0630) SpO2:  [99 %-100 %] 99 % (02/04 0630) Weight change:  Last BM Date: 05/12/12  Intake/Output from previous day: 02/03 0701 - 02/04 0700 In: 120 [P.O.:120] Out: 650 [Urine:650]     Physical Exam: General: Alert, not in acute discomfort, not short of breath at rest. Following simple commands accurately.  HEENT: Mild clinical pallor, no jaundice, no conjunctival injection or discharge.  NECK: Supple, JVP not seen, no carotid bruits, no palpable lymphadenopathy, no palpable goiter.  CHEST: Clinically clear to auscultation, no wheezes, no crackles.  HEART: Sounds 1 and 2 heard, normal, regular, high-pitched late systolic murmur.  ABDOMEN: Full, soft, non-tender, no palpable organomegaly, no palpable masses, normal bowel sounds.  GENITALIA: Not examined.  LOWER EXTREMITIES: No pitting edema, palpable peripheral pulses.  MUSCULOSKELETAL SYSTEM: Surgical site appears clean and dry.  CENTRAL NERVOUS SYSTEM: No focal neurologic deficit on gross examination.  Lab Results:  Basename 05/18/12 0422 05/17/12 1509  WBC 7.8 8.7  HGB 8.5* 9.0*  HCT 26.3* 26.7*  PLT 134* 124*    Basename 05/18/12 0422 05/17/12 1509  NA 137 138  K 4.5 4.1  CL 103 104  CO2 23 26  GLUCOSE 100* 101*  BUN 30* 35*  CREATININE 1.06 1.31*  CALCIUM 8.3* 8.3*   Recent Results (from the past 240 hour(s))  URINE CULTURE     Status: Normal   Collection Time   05/13/12  4:34 PM      Component Value Range Status Comment   Specimen Description URINE, CLEAN CATCH   Final    Special Requests NONE   Final    Culture  Setup Time 05/13/2012 17:54   Final    Colony Count 50,000 COLONIES/ML   Final    Culture     Final    Value: Multiple bacterial  morphotypes present, none predominant. Suggest appropriate recollection if clinically indicated.   Report Status 05/14/2012 FINAL   Final   MRSA PCR SCREENING     Status: Normal   Collection Time   05/13/12  4:35 PM      Component Value Range Status Comment   MRSA by PCR NEGATIVE  NEGATIVE Final      Studies/Results: Dg Chest Port 1 View  05/17/2012  *RADIOLOGY REPORT*  Clinical Data: Congestive heart failure  PORTABLE CHEST - 1 VIEW  Comparison: 05/13/2012  Findings: The cardiac shadow is stable.  Mild central vascular congestion remains without significant pulmonary edema.  No focal infiltrate or sizable effusion is seen.  No bony abnormality is noted.  IMPRESSION: Stable appearance when compared with the prior exam.   Original Report Authenticated By: Alcide Clever, M.D.     Medications: Scheduled Meds:    . aspirin EC  325 mg Oral Q breakfast  . calcitonin (salmon)  1 spray Alternating Nares Daily  . cholecalciferol  2,000 Units Oral Daily  . docusate sodium  100 mg Oral BID  . ferrous sulfate  325 mg Oral Q breakfast  . levothyroxine  112 mcg Oral QHS  . multivitamin with minerals  1 tablet Oral Daily  . senna  1 tablet Oral QHS  . sertraline  25 mg Oral Daily  . timolol  1 drop Both Eyes QHS  .  Travoprost (BAK Free)  1 drop Both Eyes QHS   Continuous Infusions:    . sodium chloride     PRN Meds:.acetaminophen, acetaminophen, HYDROcodone-acetaminophen, magnesium hydroxide, menthol-cetylpyridinium, metoCLOPramide (REGLAN) injection, metoCLOPramide, morphine injection, ondansetron (ZOFRAN) IV, ondansetron, phenol    LOS: 5 days   Marieke Lubke,CHRISTOPHER  [facility] Pager [phone]. If 8PM-8AM, please contact night-coverage at [email or web address], password [facility] 05/18/2012, 1:58 PM  LOS: 5 days

## 2012-05-18 NOTE — Discharge Summary (Signed)
Physician Discharge Summary  [name] ZOX:[id number] DOB: 1915/03/05 DOA: 05/13/2012  PCP: Gwen Pounds, MD  Admit date: 05/13/2012 Discharge date: 05/18/2012  Time spent: 40 minutes  Recommendations for Outpatient Follow-up:  1. Follow up with PMD. 2. Follow up with Dr Sheilah Mins, orthopedic surgeon, in 2 weeks.  3. Follow up with SNF MD.   Discharge Diagnoses:  Active Problems:  Fall  Closed left hip fracture  Aortic stenosis  Hypothyroidism  Urolithiasis  History of CVA (cerebrovascular accident)  Dementia  Anemia   Discharge Condition: Satisfactory.   Diet recommendation: Dysphagia-1/Thin Liquids.   Filed Weights   05/13/12 1148  Weight: 61.236 kg (135 lb)    History of present illness:  79 year old female resident of [address] [place], ALF in [place], with known history of Hypothyroidism, Glaucoma, open angle, Hyperlipidemia, Kidney stones, Macular degeneration, Osteoarthritis, s/p left internal capsule CVA 05/2003, severe aortic stenosis (Valve area 0.72) and aortic insufficiency, s/p benign breast biopsy/lumpectomy, s/p cholecystectomy, s/p cataract surgery/intraocular lens implants, dementia with sun-downing, transferred from [facility], per family request, following a fall. Precise circumstances are unclear, but patient's daughter [name] (Tel: [phone]) was contacted at 4:00 AM on 05/13/12, by staff at ALF, to inform her that patient had fallen, while in the bathroom. She was taken to [facility], where imaging studies revealed a left femoral neck fracture.   Hospital Course:  1. Fall: Patient presented following what appears to have been a mechanical fall, although precise circumstances are unclear at this time. This was complicated by a left hip fracture. See below.  2. Closed left hip fracture: This is secondary to #1. Patient was ambulant prior to fall, but was clearly at very high risk for surgical intervention, given  known history of severe aortic stenosis. Dr Rollene Rotunda provided cardiology consultation, and opined that patient has mild LV dysfunction and severe/critical aortic stenosis, not amenable to mitigation with any medical or interventional therapy at this point, and therefore, at high risk for surgery, based on her age, frailty and AS. Family opted to proceed with high risk surgery as quality of life is paramount in their minds. Dr Mack Hook provided orthopedic consultation, and patient underwent left hip hemiarthroplasty on 05/14/12, she remained stable from this view point, thereafter. . [name]. For PT/OT/Rehab at [facility].  3. Anemia: HB at presentation was 12.8, and post-operatively, dropped to 9.5 as of 05/15/12. This was ABLA, due to surgery. HB dropped further to 7.4 on 05/16/12 and patient was transfused 1 unit PRBC on that date, with satisfactory bump in HB to 9.2. HB has since remained stable.  4. Aortic stenosis: As described above, patient has known severe aortic stenosis, with valvular area of 0.72, when evaluated by Dr Arvilla Meres in 2012. At presentation, she had no symptoms of chest pain or overt CHF. 2D Echocardiogram of 05/13/12, showed mild LVH, EF of 60% to 65%, severely thickened and calcified aortic valve. Peak and mean gradients through the valve were 120 and 80 mm Hg respectively consistent with severe/critical AS. Mild regurgitation. She has remained stable clinically during her hosptalization, with close attention paid to volume status. CXR of 05/17/12, was stable, without evidence of overt pulmonary edema.  5. Dehydration: Patient appeared clinically mildly dehydrated at presentation, although lab work was not immediately available at that time. Managed with gentle iv fluids. Encouraging oral fluids, but intake is poor. SLP evaluated on 05/17/12, and has recommended D1/Thin liquids. IV fluids were discontinued  on 05/18/12. 6. Hypothyroidism: Continued on thyroxine replacement  therapy, initially iv, but transitioned to oral formulation on 05/15/12.  7. Urolithiasis: Per history. This does not appear problematic at this time. Urinalysis was negative for UTI. Urine culture grew multiple morphotypes, suggestive of contamination. Patient remained afebrile and wcc, normal.  8. History of CVA (cerebrovascular accident): Stable.  9. Dementia: Known dementia, with episodes of sun-downing. Stable.    Procedures:  See Below.   2D Echocardiogram.   Consultations:  Dr Rollene Rotunda, cardiologist.   Dr Mack Hook, orthopedic surgeon.   Discharge Exam: Filed Vitals:   05/17/12 1626 05/17/12 2058 05/18/12 0630 05/18/12 1400  BP: 126/38 133/40 152/58 128/46  Pulse: 69 70 68 69  Temp: 98.4 F (36.9 C) 98.2 F (36.8 C) 98 F (36.7 C) 99.1 F (37.3 C)  TempSrc:  Oral    Resp: 18 18 20 20   Height:      Weight:      SpO2: 100% 100% 99% 96%    General: Alert, not in acute discomfort, not short of breath at rest. Following simple commands accurately.  HEENT: Mild clinical pallor, no jaundice, no conjunctival injection or discharge.  NECK: Supple, JVP not seen, no carotid bruits, no palpable lymphadenopathy, no palpable goiter.  CHEST: Clinically clear to auscultation, no wheezes, no crackles.  HEART: Sounds 1 and 2 heard, normal, regular, high-pitched late systolic murmur.  ABDOMEN: Full, soft, non-tender, no palpable organomegaly, no palpable masses, normal bowel sounds.  GENITALIA: Not examined.  LOWER EXTREMITIES: No pitting edema, palpable peripheral pulses.  MUSCULOSKELETAL SYSTEM: Surgical site appears clean and dry.  CENTRAL NERVOUS SYSTEM: No focal neurologic deficit on gross examination.  Discharge Instructions      Discharge Orders    Future Orders Please Complete By Expires   Diet general      Weight bearing as tolerated      Change dressing      Scheduling Instructions:   Change dressing daily until no more spotting or drainage, then may d/c  dressing.   Increase activity slowly          Medication List     As of 05/18/2012  7:55 PM    TAKE these medications         aspirin 325 MG tablet   Take 325 mg by mouth daily.      calcitonin (salmon) 200 UNIT/ACT nasal spray   Commonly known as: MIACALCIN/FORTICAL   Place 1 spray into the nose daily. Alternate nostrils daily      cholecalciferol 1000 UNITS tablet   Commonly known as: VITAMIN D   Take 2,000 Units by mouth daily.      ferrous sulfate 325 (65 FE) MG tablet   Take 325 mg by mouth daily with breakfast.      HYDROcodone-acetaminophen 5-325 MG per tablet   Commonly known as: NORCO/VICODIN   Take 1 tablet by mouth every 4 (four) hours as needed for pain.      levothyroxine 112 MCG tablet   Commonly known as: SYNTHROID, LEVOTHROID   Take 112 mcg by mouth at bedtime.      multivitamin with minerals Tabs   Take 1 tablet by mouth daily.      senna 8.6 MG Tabs   Commonly known as: SENOKOT   Take 1 tablet by mouth at bedtime.      sertraline 25 MG tablet   Commonly known as: ZOLOFT   Take 25 mg by mouth daily.  timolol 0.5 % ophthalmic solution   Commonly known as: TIMOPTIC   Place 1 drop into both eyes at bedtime.      Travoprost (BAK Free) 0.004 % Soln ophthalmic solution   Commonly known as: TRAVATAN   Place 1 drop into both eyes at bedtime.        Follow-up Information    Schedule an appointment as soon as possible for a visit with Janee Morn, DAVID A., MD. (2 weeks from surgery)    Contact information:   [address] [place] [address] [phone]       Follow up with Gwen Pounds, MD. (As needed)    Contact information:   [address] [facility] [place] [address] [phone]       Please follow up. (Follow up with SNF MD. )           The results of significant diagnostics from this hospitalization (including imaging, microbiology, ancillary and laboratory) are listed below for  reference.    Significant Diagnostic Studies: Dg Femur Left  05/13/2012  *RADIOLOGY REPORT*  Clinical Data: Larey Seat.  LEFT FEMUR - 2 VIEW  Comparison: Left hip films same date.  Findings: Left femoral neck fracture with major fracture fragments in varus angulation.  No other femur fracture is noted. No obvious knee fracture.  If there is knee pain, standard knee films recommended.  IMPRESSION: Left femoral neck fracture.   Original Report Authenticated By: Lacy Duverney, M.D.    Dg Pelvis Portable  05/14/2012  *RADIOLOGY REPORT*  Clinical Data: Post left hip arthroplasty  PORTABLE PELVIS  Comparison: Left hip radiographs - 05/13/2012  Findings:  Post left hip hemiarthroplasty. Alignment appears near anatomic on this AP projection radiograph.  No definite fracture.  There is a minimal amount of expected subcutaneous emphysema and soft tissue stranding about the operative site.  Skin staples are seen about the lateral aspect of the thigh.  No radiopaque foreign body.  IMPRESSION: Post left hip hemiarthroplasty without definite evidence of complication.   Original Report Authenticated By: Tacey Ruiz, MD    Dg Chest Port 1 View  05/17/2012  *RADIOLOGY REPORT*  Clinical Data: Congestive heart failure  PORTABLE CHEST - 1 VIEW  Comparison: 05/13/2012  Findings: The cardiac shadow is stable.  Mild central vascular congestion remains without significant pulmonary edema.  No focal infiltrate or sizable effusion is seen.  No bony abnormality is noted.  IMPRESSION: Stable appearance when compared with the prior exam.   Original Report Authenticated By: Alcide Clever, M.D.    Dg Chest Port 1 View  05/13/2012  *RADIOLOGY REPORT*  Clinical Data: Hip fracture  PORTABLE CHEST - 1 VIEW  Comparison: 05/15/2010  Findings: Study is limited by poor inspiration.  There is central vascular congestion.  No acute infiltrate or pulmonary edema.  Mild bronchitic changes.  Atherosclerotic calcifications of thoracic aorta.  IMPRESSION:  Central vascular congestion without pulmonary edema. Mild bronchitic changes.   Original Report Authenticated By: Natasha Mead, M.D.    Dg Hip Portable 1 View Left  05/13/2012  *RADIOLOGY REPORT*  Clinical Data: Question hip fracture  PORTABLE LEFT HIP - 1 VIEW  Comparison: None.  Findings: Single portable view of the left hip submitted.  There is displaced fracture of the left femoral neck.  IMPRESSION: Displaced fracture of the left femoral neck.  This was made a call report.   Original Report Authenticated By: Natasha Mead, M.D.     Microbiology: Recent Results (from the  past 240 hour(s))  URINE CULTURE     Status: Normal   Collection Time   05/13/12  4:34 PM      Component Value Range Status Comment   Specimen Description URINE, CLEAN CATCH   Final    Special Requests NONE   Final    Culture  Setup Time 05/13/2012 17:54   Final    Colony Count 50,000 COLONIES/ML   Final    Culture     Final    Value: Multiple bacterial morphotypes present, none predominant. Suggest appropriate recollection if clinically indicated.   Report Status 05/14/2012 FINAL   Final   MRSA PCR SCREENING     Status: Normal   Collection Time   05/13/12  4:35 PM      Component Value Range Status Comment   MRSA by PCR NEGATIVE  NEGATIVE Final      Labs: Basic Metabolic Panel:  Lab 05/18/12 4098 05/17/12 1509 05/17/12 1122 05/16/12 0645 05/15/12 0615  NA 137 138 138 136 137  K 4.5 4.1 4.5 4.2 4.7  CL 103 104 104 103 103  CO2 23 26 28 26 24   GLUCOSE 100* 101* 106* 112* 114*  BUN 30* 35* 36* 39* 29*  CREATININE 1.06 1.31* 1.34* 1.66* 1.49*  CALCIUM 8.3* 8.3* 8.4 8.2* 8.2*  MG -- -- -- -- --  PHOS -- -- -- -- --   Liver Function Tests:  Lab 05/13/12 1324  AST 18  ALT 7  ALKPHOS 76  BILITOT 0.5  PROT 7.2  ALBUMIN 3.5   No results found for this basename: LIPASE:5,AMYLASE:5 in the last 168 hours No results found for this basename: AMMONIA:5 in the last 168 hours CBC:  Lab 05/18/12 0422 05/17/12 1509  05/17/12 1122 05/16/12 0645 05/15/12 0615  WBC 7.8 8.7 8.1 7.5 7.9  NEUTROABS -- -- -- -- --  HGB 8.5* 9.0* 9.2* 7.4* 9.5*  HCT 26.3* 26.7* 27.4* 22.5* 28.4*  MCV 92.0 91.8 91.9 92.2 91.6  PLT 134* 124* 124* 112* 125*   Cardiac Enzymes: No results found for this basename: CKTOTAL:5,CKMB:5,CKMBINDEX:5,TROPONINI:5 in the last 168 hours BNP: BNP (last 3 results) No results found for this basename: PROBNP:3 in the last 8760 hours CBG: No results found for this basename: GLUCAP:5 in the last 168 hours     Signed:  Rosalee Tolley,CHRISTOPHER  [facility] 05/18/2012, 7:55 PM

## 2012-05-19 ENCOUNTER — Inpatient Hospital Stay (HOSPITAL_COMMUNITY): Payer: Medicare Other

## 2012-05-19 DIAGNOSIS — G9340 Encephalopathy, unspecified: Secondary | ICD-10-CM

## 2012-05-19 DIAGNOSIS — N390 Urinary tract infection, site not specified: Secondary | ICD-10-CM

## 2012-05-19 DIAGNOSIS — K567 Ileus, unspecified: Secondary | ICD-10-CM

## 2012-05-19 DIAGNOSIS — E039 Hypothyroidism, unspecified: Secondary | ICD-10-CM

## 2012-05-19 LAB — BASIC METABOLIC PANEL
Calcium: 8.2 mg/dL — ABNORMAL LOW (ref 8.4–10.5)
GFR calc Af Amer: 53 mL/min — ABNORMAL LOW (ref >90)
GFR calc non Af Amer: 46 mL/min — ABNORMAL LOW (ref >90)
Sodium: 136 mEq/L (ref 135–145)

## 2012-05-19 LAB — URINALYSIS, ROUTINE W REFLEX MICROSCOPIC
Bilirubin Urine: NEGATIVE
Ketones, ur: 15 mg/dL — AB
Specific Gravity, Urine: 1.02 (ref 1.005–1.030)
Urobilinogen, UA: 0.2 mg/dL (ref 0.0–1.0)

## 2012-05-19 LAB — CBC
MCH: 30.5 pg (ref 26.0–34.0)
MCHC: 33.3 g/dL (ref 30.0–36.0)
Platelets: 180 10*3/uL (ref 150–400)
RDW: 13.4 % (ref 11.5–15.5)

## 2012-05-19 LAB — MAGNESIUM: Magnesium: 2.3 mg/dL (ref 1.5–2.5)

## 2012-05-19 LAB — URINE MICROSCOPIC-ADD ON

## 2012-05-19 LAB — GLUCOSE, CAPILLARY: Glucose-Capillary: 91 mg/dL (ref 70–99)

## 2012-05-19 MED ORDER — BISACODYL 10 MG RE SUPP
10.0000 mg | Freq: Two times a day (BID) | RECTAL | Status: DC
  Administered 2012-05-19 – 2012-05-20 (×2): 10 mg via RECTAL
  Filled 2012-05-19 (×2): qty 1

## 2012-05-19 MED ORDER — CIPROFLOXACIN IN D5W 400 MG/200ML IV SOLN
400.0000 mg | Freq: Two times a day (BID) | INTRAVENOUS | Status: DC
  Administered 2012-05-19 – 2012-05-20 (×2): 400 mg via INTRAVENOUS
  Filled 2012-05-19 (×3): qty 200

## 2012-05-19 MED ORDER — HYDROCODONE-ACETAMINOPHEN 5-325 MG PO TABS
1.0000 | ORAL_TABLET | Freq: Four times a day (QID) | ORAL | Status: DC | PRN
  Administered 2012-05-20: 1 via ORAL
  Filled 2012-05-19: qty 1

## 2012-05-19 MED ORDER — NAPHAZOLINE-GLYCERIN 0.012-0.2 % OP SOLN
1.0000 [drp] | OPHTHALMIC | Status: DC | PRN
  Filled 2012-05-19: qty 15

## 2012-05-19 NOTE — Progress Notes (Signed)
Speech Language Pathology Dysphagia Treatment Patient Details Name: [name] MRN: [id number] DOB: 1915/04/14 Today's Date: 05/19/2012 Time: 9562-1308 SLP Time Calculation (min): 29 min  Assessment / Plan / Recommendation Clinical Impression  Patient was observed with dinner meal.  SLP assisted with feeding (pt. has difficulty getting spoon to mouth. Patient was noted to cough with her tea when using a straw, but not with cup sips.  Pt. c/o nausea after eating approximately 1/3 of her meal.  "I've had enough."  Frequent belching was noted as well.  Question esophageal dysphagia.  GI following for constipation vs. ileus.  RN notified to results of this session.  Patient left in upright position with emesis basin.    Diet Recommendation  Continue with Current Diet: Dysphagia 1 (puree);Thin liquid Initiate / Change Diet:  (No straws)    SLP Plan Continue with current plan of care   Pertinent Vitals/Pain N/A   Swallowing Goals  SLP Swallowing Goals Patient will consume recommended diet without observed clinical signs of aspiration with: Moderate cueing Swallow Study Goal #1 - Progress: Partly Met Patient will utilize recommended strategies during swallow to increase swallowing safety with: Moderate cueing Swallow Study Goal #2 - Progress: Partly met  General Temperature Spikes Noted: No Respiratory Status: Room air Behavior/Cognition: Alert;Requires cueing Oral Cavity - Dentition: Dentures, top;Missing dentition Patient Positioning: Upright in bed  Oral Cavity - Oral Hygiene Patient is HIGH RISK - Oral Care Protocol followed (see row info): Yes   Dysphagia Treatment Treatment focused on: Skilled observation of diet tolerance;Patient/family/caregiver education Treatment Methods/Modalities: Skilled observation Patient observed directly with PO's: Yes Type of PO's observed: Dysphagia 1 (puree);Thin liquids Feeding: Needs assist;Needs set up Liquids provided via: Cup;Straw Type of  cueing: Verbal Amount of cueing: Minimal   GO     Maryjo Rochester T 05/19/2012, 5:11 PM

## 2012-05-19 NOTE — Progress Notes (Signed)
Subjective: Asked to see patient for constipation versus ileus. Patient is unable to provide any history. Chart reviewed, including labs and xray. Has constipation, with prior trials of Senna and colace alone (as best I can tell with my "order history" review).  Objective: Vital signs in last 24 hours: Temp:  [97.1 F (36.2 C)-97.5 F (36.4 C)] 97.5 F (36.4 C) (02/05 1300) Pulse Rate:  [74-80] 74  (02/05 1300) Resp:  [18-20] 18  (02/05 1300) BP: (136-150)/(44-56) 136/44 mmHg (02/05 1300) SpO2:  [96 %-100 %] 96 % (02/05 1300) Weight change:  Last BM Date: 05/12/12  PE: GEN:  Awake, demented, answers some questions appropriately ABD:  Very mild distention, non-tender  Lab Results: CBC    Component Value Date/Time   WBC 8.2 05/19/2012 1305   RBC 3.05* 05/19/2012 1305   HGB 9.3* 05/19/2012 1305   HCT 27.9* 05/19/2012 1305   PLT 180 05/19/2012 1305   MCV 91.5 05/19/2012 1305   MCH 30.5 05/19/2012 1305   MCHC 33.3 05/19/2012 1305   RDW 13.4 05/19/2012 1305   LYMPHSABS 0.8 05/13/2010 0635   MONOABS 0.7 05/13/2010 0635   EOSABS 0.6 05/13/2010 0635   BASOSABS 0.1 05/13/2010 1610   Studies/Results: Dg Abd 1 View  05/19/2012  *RADIOLOGY REPORT*  Clinical Data: Constipation.  Abdominal pain.  ABDOMEN - 1 VIEW  Comparison: CT scan from 09/06/2009  Findings: Supine abdomen shows mild diffuse gaseous colonic distention with mild gaseous distention of small bowel and left upper quadrant.  No evidence for an overt bowel obstruction.  The convex leftward lumbar scoliosis noted with diffuse bony demineralization.  The patient is status post left hip hemiarthroplasty.  IMPRESSION: Mild diffuse gaseous distention of colon.  Imaging features could be compatible with a degree of colonic ileus.   Original Report Authenticated By: Kennith Center, M.D.    Assessment:  1.  Post-operative constipation versus ileus.  Overall constellation of findings much more typical of constipation rather than ileus.   Plan:  1.   Minimize narcotics, as clinically feasible. 2.  Increase activity level, as clinically feasible. 3.  Trial of dulcolax suppositories today. 4.  If suppositories not effective, patient will need rectal exam for possible manual disimpaction. 5.  Will follow; thank you for the consult.   Freddy Jaksch 05/19/2012, 4:20 PM

## 2012-05-19 NOTE — Progress Notes (Signed)
[facility] PROGRESS NOTE  [name] ZOX:[id number] DOB: [birth year] DOA: 05/13/2012 PCP: Gwen Pounds, MD  Assessment/Plan:  1. Closed left hip fracture: This is secondary to a  fall. Dr Mack Hook provided orthopedic consultation, and patient underwent left hip hemiarthroplasty on 05/14/12, she remained stable from this view point, thereafter. . [name]. For PT/OT/Rehab at [facility].  3. Anemia: HB at presentation was 12.8, and post-operatively, dropped to 9.5 as of 05/15/12. This was ABLA, due to surgery. HB dropped further to 7.4 on 05/16/12 and patient was transfused 1 unit PRBC on that date, with satisfactory bump in HB to 9.2. HB  Stable at 9.3.  4. Aortic stenosis: As described above, patient has known severe aortic stenosis, with valvular area of 0.72, when evaluated by Dr Arvilla Meres in 2012. At presentation, she had no symptoms of chest pain or overt CHF. 2D Echocardiogram of 05/13/12, showed mild LVH, EF of 60% to 65%, severely thickened and calcified aortic valve. Peak and mean gradients through the valve were 120 and 80 mm Hg respectively consistent with severe/critical AS. Mild regurgitation. She has remained stable clinically during her hosptalization, with close attention paid to volume status. CXR of 05/17/12, was stable, without evidence of overt pulmonary edema.   5. Dehydration: Patient appeared clinically mildly dehydrated at presentation, although lab work was not immediately available at that time. Managed with gentle iv fluids. Encouraging oral fluids, but intake is poor. SLP evaluated on 05/17/12, and has recommended D1/Thin liquids. IV fluids were discontinued on 05/18/12. Cr decrease to 1.0  6. Hypothyroidism: Continued on thyroxine replacement therapy, initially iv, but transitioned to oral formulation on 05/15/12.  7. Urolithiasis: Per history. This does not appear problematic at this time.  8. History of CVA (cerebrovascular accident): Stable.  9. Dementia: Known  dementia, with episodes of sun-downing. Stable.  10.Encephalopathy: delirium vs secondary to infection vs medications narcotics.Marland Kitchen UA positive for nitrates.  11-Abdominal pain/Constipation: KUB. Bowel regimen.  12-UTI: UA with 11-20 WBC. Will tx for UTI with ciprofloxacin. Will remove foley. Need to monitor urine out put, bladder scan as needed. Urine culture.    Code Status: DNR.  Family Communication: Son at bedside plan of care discussed with him.  Disposition Plan: Cancel discharge. SNF when stable.    Consultants: Dr Rollene Rotunda, cardiologist.  Dr Mack Hook, orthopedic surgeon.  Dr Dulce Sellar, GI.   Procedures: See Below.  2D Echocardiogram.    Antibiotics:  Ciprofloxacin 2-05.   HPI/Subjective: Patient awake, slow respond. She relates pain all over. No specific complaint. She received earlier Vicodin for pain. No bowel movement last days. Not eating well per family report. Son at bedside. Plan of care discussed with him.   Objective: Filed Vitals:   05/18/12 0630 05/18/12 1400 05/18/12 2217 05/19/12 0630  BP: 152/58 128/46 147/51 150/56  Pulse: 68 69 80 74  Temp: 98 F (36.7 C) 99.1 F (37.3 C) 97.5 F (36.4 C) 97.1 F (36.2 C)  TempSrc:      Resp: 20 20 20 20   Height:      Weight:      SpO2: 99% 96% 100% 100%    Intake/Output Summary (Last 24 hours) at 05/19/12 1146 Last data filed at 05/19/12 0637  Gross per 24 hour  Intake     60 ml  Output   1050 ml  Net   -990 ml   Filed Weights   05/13/12 1148  Weight: 61.236 kg (135 lb)    Exam:  General:  No distress, slow respond, mild sedated.   Cardiovascular: S 1, S 2 RRR  Respiratory: Crackles bases, no wheezes.  Abdomen: BS present, Tender to palpation left quadrant, no rigidity, no guarding.   Data Reviewed: Basic Metabolic Panel:  Lab 05/18/12 1610 05/17/12 1509 05/17/12 1122 05/16/12 0645 05/15/12 0615  NA 137 138 138 136 137  K 4.5 4.1 4.5 4.2 4.7  CL 103 104 104 103 103  CO2 23 26  28 26 24   GLUCOSE 100* 101* 106* 112* 114*  BUN 30* 35* 36* 39* 29*  CREATININE 1.06 1.31* 1.34* 1.66* 1.49*  CALCIUM 8.3* 8.3* 8.4 8.2* 8.2*  MG -- -- -- -- --  PHOS -- -- -- -- --   Liver Function Tests:  Lab 05/13/12 1324  AST 18  ALT 7  ALKPHOS 76  BILITOT 0.5  PROT 7.2  ALBUMIN 3.5   CBC:  Lab 05/18/12 0422 05/17/12 1509 05/17/12 1122 05/16/12 0645 05/15/12 0615  WBC 7.8 8.7 8.1 7.5 7.9  NEUTROABS -- -- -- -- --  HGB 8.5* 9.0* 9.2* 7.4* 9.5*  HCT 26.3* 26.7* 27.4* 22.5* 28.4*  MCV 92.0 91.8 91.9 92.2 91.6  PLT 134* 124* 124* 112* 125*   Cardiac Enzymes: No results found for this basename: CKTOTAL:5,CKMB:5,CKMBINDEX:5,TROPONINI:5 in the last 168 hours BNP (last 3 results) No results found for this basename: PROBNP:3 in the last 8760 hours CBG: No results found for this basename: GLUCAP:5 in the last 168 hours  Recent Results (from the past 240 hour(s))  URINE CULTURE     Status: Normal   Collection Time   05/13/12  4:34 PM      Component Value Range Status Comment   Specimen Description URINE, CLEAN CATCH   Final    Special Requests NONE   Final    Culture  Setup Time 05/13/2012 17:54   Final    Colony Count 50,000 COLONIES/ML   Final    Culture     Final    Value: Multiple bacterial morphotypes present, none predominant. Suggest appropriate recollection if clinically indicated.   Report Status 05/14/2012 FINAL   Final   MRSA PCR SCREENING     Status: Normal   Collection Time   05/13/12  4:35 PM      Component Value Range Status Comment   MRSA by PCR NEGATIVE  NEGATIVE Final      Studies: Dg Chest Port 1 View  05/17/2012  *RADIOLOGY REPORT*  Clinical Data: Congestive heart failure  PORTABLE CHEST - 1 VIEW  Comparison: 05/13/2012  Findings: The cardiac shadow is stable.  Mild central vascular congestion remains without significant pulmonary edema.  No focal infiltrate or sizable effusion is seen.  No bony abnormality is noted.  IMPRESSION: Stable appearance  when compared with the prior exam.   Original Report Authenticated By: Alcide Clever, M.D.     Scheduled Meds:   . aspirin EC  325 mg Oral Q breakfast  . calcitonin (salmon)  1 spray Alternating Nares Daily  . cholecalciferol  2,000 Units Oral Daily  . docusate sodium  100 mg Oral BID  . ferrous sulfate  325 mg Oral Q breakfast  . levothyroxine  112 mcg Oral QHS  . multivitamin with minerals  1 tablet Oral Daily  . senna  1 tablet Oral QHS  . sertraline  25 mg Oral Daily  . timolol  1 drop Both Eyes QHS  . Travoprost (BAK Free)  1 drop Both Eyes QHS   Continuous  Infusions:   Active Problems:  Fall  Closed left hip fracture  Aortic stenosis  Hypothyroidism  Urolithiasis  History of CVA (cerebrovascular accident)  Dementia  Anemia    Time spent: 25 minutes.     Emet Rafanan  [facility] Pager [phone]. If 8PM-8AM, please contact night-coverage at [email or web address], password [facility] 05/19/2012, 11:46 AM  LOS: 6 days

## 2012-05-19 NOTE — Progress Notes (Signed)
PATIENT ID: [name]  MRN: [id number]  DOB/AGE:  [birth year] / 79 y.o.  5 Days Post-Op Procedure(s) (LRB): ARTHROPLASTY BIPOLAR HIP (Left)    PROGRESS NOTE Subjective: Patient is resting comfortably this morning.  She replies in limited manner today to verbal questions if spoken loudly enough.  Apparently became a little sedated following pain med and no BM yet.  KUB per IM MD pending.  Objective: Vital signs in last 24 hours: Filed Vitals:   05/18/12 0630 05/18/12 1400 05/18/12 2217 05/19/12 0630  BP: 152/58 128/46 147/51 150/56  Pulse: 68 69 80 74  Temp: 98 F (36.7 C) 99.1 F (37.3 C) 97.5 F (36.4 C) 97.1 F (36.2 C)  TempSrc:      Resp: 20 20 20 20   Height:      Weight:      SpO2: 99% 96% 100% 100%      Intake/Output from previous day: I/O last 3 completed shifts: In: 180 [P.O.:180] Out: 1050 [Urine:1050]   Intake/Output this shift:     LABORATORY DATA:  Basename 05/18/12 0422 05/17/12 1509  WBC 7.8 8.7  HGB 8.5* 9.0*  HCT 26.3* 26.7*  PLT 134* 124*  NA 137 138  K 4.5 4.1  CL 103 104  CO2 23 26  BUN 30* 35*  CREATININE 1.06 1.31*  GLUCOSE 100* 101*  GLUCAP -- --  INR -- --  CALCIUM 8.3* --    Examination: F/E toes/ankel with sole stroke Dressing with 1 scant spot of sanguinous drainage, mostly dry  Assessment:   5 Days Post-Op Procedure(s) (LRB): ARTHROPLASTY BIPOLAR HIP (Left)  Plan: PT/OT WBAT, THA  anterior precautions Dressing change today.  DVT Prophylaxis: SCD in-patient, ASA 325 mg daily   DISCHARGE PLAN: Skilled Nursing Facility/Rehab --may d/c once hospitalists deem medically stable and SNF bed ready.  RTC to see me 2 weeks postop  DISCHARGE NEEDS: HHPT, HHRN, Walker and 3-in-1 comode seat

## 2012-05-19 NOTE — Progress Notes (Signed)
Utilization review completed. Deaaron Fulghum, RN, BSN. 

## 2012-05-20 DIAGNOSIS — G9340 Encephalopathy, unspecified: Secondary | ICD-10-CM

## 2012-05-20 DIAGNOSIS — K560 Paralytic ileus: Secondary | ICD-10-CM

## 2012-05-20 DIAGNOSIS — N390 Urinary tract infection, site not specified: Secondary | ICD-10-CM

## 2012-05-20 LAB — CBC
HCT: 30 % — ABNORMAL LOW (ref 36.0–46.0)
MCV: 90.4 fL (ref 78.0–100.0)
RBC: 3.32 MIL/uL — ABNORMAL LOW (ref 3.87–5.11)
RDW: 13.2 % (ref 11.5–15.5)
WBC: 10.5 10*3/uL (ref 4.0–10.5)

## 2012-05-20 LAB — BASIC METABOLIC PANEL
BUN: 26 mg/dL — ABNORMAL HIGH (ref 6–23)
CO2: 24 mEq/L (ref 19–32)
Chloride: 100 mEq/L (ref 96–112)
Creatinine, Ser: 0.93 mg/dL (ref 0.50–1.10)

## 2012-05-20 MED ORDER — CEPHALEXIN 250 MG PO CAPS
250.0000 mg | ORAL_CAPSULE | Freq: Two times a day (BID) | ORAL | Status: DC
  Filled 2012-05-20 (×2): qty 1

## 2012-05-20 MED ORDER — CEPHALEXIN 250 MG/5ML PO SUSR
250.0000 mg | Freq: Two times a day (BID) | ORAL | Status: DC
  Administered 2012-05-20 – 2012-05-21 (×3): 250 mg via ORAL
  Filled 2012-05-20 (×4): qty 5

## 2012-05-20 NOTE — Progress Notes (Signed)
[facility] PROGRESS NOTE  [name] VWU:[id number] DOB: [birth year] DOA: 05/13/2012 PCP: Gwen Pounds, MD  Assessment/Plan:  1. Closed left hip fracture: This is secondary to a  fall. Dr Mack Hook provided orthopedic consultation, and patient underwent left hip hemiarthroplasty on 05/14/12,. [name]. For PT/OT/Rehab at [facility].   3. Anemia: HB at presentation was 12.8, and post-operatively, dropped to 9.5 as of 05/15/12. This was ABLA, due to surgery. HB dropped further to 7.4 on 05/16/12 and patient was transfused 1 unit PRBC on that date, with satisfactory bump in HB to 9.2. HB  Stable at 10.   4. Aortic stenosis: As described above, patient has known severe aortic stenosis, with valvular area of 0.72, when evaluated by Dr Arvilla Meres in 2012. At presentation, she had no symptoms of chest pain or overt CHF. 2D Echocardiogram of 05/13/12, showed mild LVH, EF of 60% to 65%, severely thickened and calcified aortic valve. Peak and mean gradients through the valve were 120 and 80 mm Hg respectively consistent with severe/critical AS. Mild regurgitation. She has remained stable clinically during her hosptalization, with close attention paid to volume status. CXR of 05/17/12, was stable, without evidence of overt pulmonary edema.   5. Dehydration: Patient appeared clinically mildly dehydrated at presentation, although lab work was not immediately available at that time. Managed with gentle iv fluids. Encouraging oral fluids, but intake is poor. SLP evaluated on 05/17/12, and has recommended D1/Thin liquids. IV fluids were discontinued on 05/18/12. Cr decrease to 0.9  6. Hypothyroidism: Continued on thyroxine replacement therapy, initially iv, but transitioned to oral formulation on 05/15/12.  7. Urolithiasis: Per history. This does not appear problematic at this time.  8. History of CVA (cerebrovascular accident): Stable.  9. Dementia: Known dementia, with episodes of sun-downing. Stable.   10.Encephalopathy: delirium vs secondary to infection vs medications narcotics.Marland Kitchen UA positive for nitrates. Treating for infection, ileus. Avoid narcotics. Change ciprofloxacin to keflex.. Frequent orientation.   11-Abdominal pain/Constipation: KUB colonic ileus vs constipation. Appreciate Dr Dulce Sellar help. Had small BM. Will try another laxative. No vomiting today. No electrolytes abnormalities.   12-UTI: UA with 11-20 WBC. Need to monitor urine out put, bladder scan as needed. Urine culture pending. Will discontinue ciprofloxacin it can cause delirium in the elderly. Will try keflex. Daughter think patient might have take keflex before.    Code Status: DNR.  Family Communication: Son at bedside plan of care discussed with him.  Disposition Plan: Cancel discharge. SNF when stable. If stable will try for discharge 2-7.    Consultants: Dr Rollene Rotunda, cardiologist.  Dr Mack Hook, orthopedic surgeon.  Dr Dulce Sellar, GI.   Procedures: See Below.  2D Echocardiogram.    Antibiotics:  Ciprofloxacin 2-05. ---2-6  HPI/Subjective: Patient more awake today, trying to reach things whit her hands, confuse. Relates eyes bother.  Said I am doing OK.  Per nurse report patient had moderate BM, ate few bites of breakfast.   Objective: Filed Vitals:   05/19/12 0630 05/19/12 1300 05/19/12 2100 05/20/12 0500  BP: 150/56 136/44 150/84 142/74  Pulse: 74 74 83 71  Temp: 97.1 F (36.2 C) 97.5 F (36.4 C) 98.6 F (37 C) 98.7 F (37.1 C)  TempSrc:    Oral  Resp: 20 18 18 18   Height:      Weight:      SpO2: 100% 96% 98% 97%    Intake/Output Summary (Last 24 hours) at 05/20/12 1146 Last data filed at 05/20/12 2956  Gross  per 24 hour  Intake      0 ml  Output    430 ml  Net   -430 ml   Filed Weights   05/13/12 1148  Weight: 61.236 kg (135 lb)    Exam:   General:  No distress, slow respond,more awake today. Trying to reach with hands.   Cardiovascular: S 1, S 2  RRR  Respiratory: Crackles bases, no wheezes.  Abdomen: BS present, no tenderness,  no rigidity, no guarding.   Right hip with dressing, small amount of blood on dressing.   Data Reviewed: Basic Metabolic Panel:  Lab 05/20/12 4098 05/19/12 1538 05/19/12 1305 05/18/12 0422 05/17/12 1509 05/17/12 1122  NA 135 -- 136 137 138 138  K 4.2 -- 4.2 4.5 4.1 4.5  CL 100 -- 103 103 104 104  CO2 24 -- 25 23 26 28   GLUCOSE 146* -- 96 100* 101* 106*  BUN 26* -- 28* 30* 35* 36*  CREATININE 0.93 -- 1.01 1.06 1.31* 1.34*  CALCIUM 8.6 -- 8.2* 8.3* 8.3* 8.4  MG -- 2.3 -- -- -- --  PHOS -- -- -- -- -- --   Liver Function Tests:  Lab 05/13/12 1324  AST 18  ALT 7  ALKPHOS 76  BILITOT 0.5  PROT 7.2  ALBUMIN 3.5   CBC:  Lab 05/20/12 0635 05/19/12 1305 05/18/12 0422 05/17/12 1509 05/17/12 1122  WBC 10.5 8.2 7.8 8.7 8.1  NEUTROABS -- -- -- -- --  HGB 10.1* 9.3* 8.5* 9.0* 9.2*  HCT 30.0* 27.9* 26.3* 26.7* 27.4*  MCV 90.4 91.5 92.0 91.8 91.9  PLT 238 180 134* 124* 124*   Cardiac Enzymes: No results found for this basename: CKTOTAL:5,CKMB:5,CKMBINDEX:5,TROPONINI:5 in the last 168 hours BNP (last 3 results) No results found for this basename: PROBNP:3 in the last 8760 hours CBG:  Lab 05/19/12 1505  GLUCAP 91    Recent Results (from the past 240 hour(s))  URINE CULTURE     Status: Normal   Collection Time   05/13/12  4:34 PM      Component Value Range Status Comment   Specimen Description URINE, CLEAN CATCH   Final    Special Requests NONE   Final    Culture  Setup Time 05/13/2012 17:54   Final    Colony Count 50,000 COLONIES/ML   Final    Culture     Final    Value: Multiple bacterial morphotypes present, none predominant. Suggest appropriate recollection if clinically indicated.   Report Status 05/14/2012 FINAL   Final   MRSA PCR SCREENING     Status: Normal   Collection Time   05/13/12  4:35 PM      Component Value Range Status Comment   MRSA by PCR NEGATIVE  NEGATIVE Final       Studies: Dg Abd 1 View  05/19/2012  *RADIOLOGY REPORT*  Clinical Data: Constipation.  Abdominal pain.  ABDOMEN - 1 VIEW  Comparison: CT scan from 09/06/2009  Findings: Supine abdomen shows mild diffuse gaseous colonic distention with mild gaseous distention of small bowel and left upper quadrant.  No evidence for an overt bowel obstruction.  The convex leftward lumbar scoliosis noted with diffuse bony demineralization.  The patient is status post left hip hemiarthroplasty.  IMPRESSION: Mild diffuse gaseous distention of colon.  Imaging features could be compatible with a degree of colonic ileus.   Original Report Authenticated By: Kennith Center, M.D.     Scheduled Meds:    . aspirin EC  325 mg Oral Q breakfast  . bisacodyl  10 mg Rectal BID  . calcitonin (salmon)  1 spray Alternating Nares Daily  . cholecalciferol  2,000 Units Oral Daily  . ciprofloxacin  400 mg Intravenous Q12H  . ferrous sulfate  325 mg Oral Q breakfast  . levothyroxine  112 mcg Oral QHS  . multivitamin with minerals  1 tablet Oral Daily  . sertraline  25 mg Oral Daily  . timolol  1 drop Both Eyes QHS  . Travoprost (BAK Free)  1 drop Both Eyes QHS   Continuous Infusions:   Active Problems:  Fall  Closed left hip fracture  Aortic stenosis  Hypothyroidism  Urolithiasis  History of CVA (cerebrovascular accident)  Dementia  Anemia  UTI (lower urinary tract infection)  Encephalopathy  Ileus    Time spent: 25 minutes.     REGALADO,BELKYS  [facility] Pager [phone]. If 8PM-8AM, please contact night-coverage at [email or web address], password [facility] 05/20/2012, 11:46 AM  LOS: 7 days

## 2012-05-21 LAB — BASIC METABOLIC PANEL
BUN: 27 mg/dL — ABNORMAL HIGH (ref 6–23)
Creatinine, Ser: 1.07 mg/dL (ref 0.50–1.10)
GFR calc Af Amer: 50 mL/min — ABNORMAL LOW (ref >90)
GFR calc non Af Amer: 43 mL/min — ABNORMAL LOW (ref >90)

## 2012-05-21 LAB — CBC
HCT: 27.3 % — ABNORMAL LOW (ref 36.0–46.0)
MCH: 30.5 pg (ref 26.0–34.0)
MCHC: 33.7 g/dL (ref 30.0–36.0)
MCV: 90.4 fL (ref 78.0–100.0)
RDW: 13.4 % (ref 11.5–15.5)

## 2012-05-21 LAB — URINE CULTURE: Special Requests: NORMAL

## 2012-05-21 MED ORDER — BISACODYL 10 MG RE SUPP: 10.0000 mg | RECTAL | Status: AC | PRN

## 2012-05-21 MED ORDER — CEPHALEXIN 250 MG/5ML PO SUSR: 250.0000 mg | Freq: Two times a day (BID) | ORAL | Status: AC

## 2012-05-21 MED ORDER — NAPHAZOLINE-GLYCERIN 0.012-0.2 % OP SOLN: 1.0000 [drp] | OPHTHALMIC | Status: AC | PRN

## 2012-05-21 MED ORDER — DOCUSATE SODIUM 100 MG PO CAPS
100.0000 mg | ORAL_CAPSULE | Freq: Two times a day (BID) | ORAL | Status: DC
  Administered 2012-05-21: 100 mg via ORAL

## 2012-05-21 MED ORDER — HYDROCODONE-ACETAMINOPHEN 5-325 MG PO TABS: 1.0000 | ORAL_TABLET | Freq: Four times a day (QID) | ORAL | Status: AC | PRN

## 2012-05-21 MED ORDER — DSS 100 MG PO CAPS: 100.0000 mg | ORAL_CAPSULE | Freq: Two times a day (BID) | ORAL | Status: AC

## 2012-05-21 NOTE — Discharge Summary (Signed)
Physician Discharge Summary  [name]:[id number] DOB: October 01, 1914 DOA: 05/13/2012  PCP: Gwen Pounds, MD  Admit date: 05/13/2012 Discharge date: 05/21/2012  Time spent: 35 minutes  Recommendations for Outpatient Follow-up:  1. Follow up with PMD. 2. Follow up with Dr Sheilah Mins, orthopedic surgeon, in 2 weeks.  3. Follow up with SNF MD.  4. Please encourage oral intake.  5. Please avoid opioids as possible. 6. Doculax suppository PRN for constipation.  7. Repeat CBC and B-met.   Discharge Diagnoses:  Fall  Closed left hip fracture  Aortic stenosis  Hypothyroidism  Urolithiasis  History of CVA (cerebrovascular accident)  Dementia  Anemia  E coli, UTI (lower urinary tract infection)  Encephalopathy  Ileus   Discharge Condition: Stable.   Diet recommendation: Dysphagia-1/Thin Liquids   Filed Weights   05/13/12 1148  Weight: 61.236 kg (135 lb)    History of present illness:  79 year old female resident of [facility], ALF in [place], with known history of Hypothyroidism, Glaucoma, open angle, Hyperlipidemia, Kidney stones, Macular degeneration, Osteoarthritis, s/p left internal capsule CVA 05/2003, severe aortic stenosis (Valve area 0.72) and aortic insufficiency, s/p benign breast biopsy/lumpectomy, s/p cholecystectomy, s/p cataract surgery/intraocular lens implants, dementia with sun-downing, transferred from [facility], per family request, following a fall. Precise circumstances are unclear, but patient's daughter [name] (Tel: [phone]) was contacted at 4:00 AM on 05/13/12, by staff at ALF, to inform her that patient had fallen, while in the bathroom. She was taken to [facility], where imaging studies revealed a left femoral neck fracture.   Hospital Course:  1. Fall: Patient presented following what appears to have been a mechanical fall, although precise circumstances are unclear at this time. This was complicated by a  left hip fracture. See below.  2. Closed left hip fracture: This is secondary to #1. Patient was ambulant prior to fall, but was clearly at very high risk for surgical intervention, given known history of severe aortic stenosis. Dr Rollene Rotunda provided cardiology consultation, and opined that patient has mild LV dysfunction and severe/critical aortic stenosis, not amenable to mitigation with any medical or interventional therapy at this point, and therefore, at high risk for surgery, based on her age, frailty and AS. Family opted to proceed with high risk surgery as quality of life is paramount in their minds. Dr Mack Hook provided orthopedic consultation, and patient underwent left hip hemiarthroplasty on 05/14/12, she remained stable from this view point, thereafter. . [name]. For PT/OT/Rehab at [facility].  3. Anemia: HB at presentation was 12.8, and post-operatively, dropped to 9.5 as of 05/15/12. This was ABLA, due to surgery. HB dropped further to 7.4 on 05/16/12 and patient was transfused 1 unit PRBC on that date, with satisfactory bump in HB to 9.2. HB has since remained stable. Ferrous sulfate daily.  4. Aortic stenosis: As described above, patient has known severe aortic stenosis, with valvular area of 0.72, when evaluated by Dr Arvilla Meres in 2012. At presentation, she had no symptoms of chest pain or overt CHF. 2D Echocardiogram of 05/13/12, showed mild LVH, EF of 60% to 65%, severely thickened and calcified aortic valve. Peak and mean gradients through the valve were 120 and 80 mm Hg respectively consistent with severe/critical AS. Mild regurgitation. She has remained stable clinically during her hosptalization, with close attention paid to volume status. CXR of 05/17/12, was stable, without evidence of overt pulmonary edema.  5. Dehydration: Patient appeared clinically mildly dehydrated  at presentation, although lab work was not immediately available at that time. Managed with gentle iv  fluids. Encouraging oral fluids, but intake is poor. SLP evaluated on 05/17/12, and has recommended D1/Thin liquids. IV fluids were discontinued on 05/18/12. Encourage oral intake.  6. Hypothyroidism: Continued on thyroxine replacement therapy, initially iv, but transitioned to oral formulation on 05/15/12.  7. Urolithiasis: Per history. This does not appear problematic at this time. Urinalysis was negative for UTI. Urine culture grew multiple morphotypes, suggestive of contamination. Patient remained afebrile and wcc, normal.  8. History of CVA (cerebrovascular accident): Stable.  9. Dementia: Known dementia, with episodes of sun-downing. Stable.  Patient was suppose to be discharge 2-05, but discharge was delay because patient develop colonic ileus vs constipation, UTI and delirious.  10.Encephalopathy: delirium vs secondary to infection vs medications narcotics.Marland Kitchen UA positive for nitrates. Treating for infection, ileus. Avoid narcotics. Change ciprofloxacin to keflex.. Frequent orientation. Patient today more awake, answering some question, no hallucinations.  11-Abdominal pain/Constipation: KUB colonic ileus vs constipation. Appreciate Dr Dulce Sellar help. Had 2 BM on the 5 th and 1 BM yesterday per nurse report.. No electrolytes abnormalities. Docusate BID. Doculax suppository PRN.  12-UTI: UA with 11-20 WBC. Need to monitor urine out put, bladder scan as needed. Urine culture E coli sensitive to cephalosporine. Will discontinue ciprofloxacin it can cause delirium in the elderly. Continue with keflex. Day 3 antibiotics. Tx for total 7 days.      Procedures: See Below.  2D Echocardiogram.    Consultations: Dr Rollene Rotunda, cardiologist.  Dr Mack Hook, orthopedic surgeon.    Discharge Exam: Filed Vitals:   05/20/12 0500 05/20/12 1300 05/20/12 2126 05/21/12 0616  BP: 142/74 132/42 131/44 137/40  Pulse: 71 78 84 71  Temp: 98.7 F (37.1 C) 97.4 F (36.3 C) 97.7 F (36.5 C) 98.1 F (36.7 C)   TempSrc: Oral  Oral Oral  Resp: 18 18 18 18   Height:      Weight:      SpO2: 97% 100% 98% 97%    General: Alert, not in acute discomfort, not short of breath at rest. Following simple commands accurately.  HEENT: Mild clinical pallor, no jaundice, no conjunctival injection or discharge.  NECK: Supple, JVP not seen, no carotid bruits, no palpable lymphadenopathy, no palpable goiter.  CHEST: Clinically clear to auscultation, no wheezes, no crackles.  HEART: Sounds 1 and 2 heard, normal, regular, high-pitched late systolic murmur.  ABDOMEN: Full, soft, non-tender, no palpable organomegaly, no palpable masses, normal bowel sounds.  GENITALIA: Not examined.  LOWER EXTREMITIES: No pitting edema, palpable peripheral pulses.  MUSCULOSKELETAL SYSTEM: Surgical site appears clean and dry.  CENTRAL NERVOUS SYSTEM: No focal neurologic deficit on gross examination.  Discharge Instructions  Discharge Orders    Future Orders Please Complete By Expires   Diet general      Diet - low sodium heart healthy      Weight bearing as tolerated      Change dressing      Scheduling Instructions:   Change dressing daily until no more spotting or drainage, then may d/c dressing.   Increase activity slowly      Increase activity slowly          Medication List     As of 05/21/2012 10:43 AM    TAKE these medications         aspirin 325 MG tablet   Take 325 mg by mouth daily.      bisacodyl 10 MG suppository  Commonly known as: DULCOLAX   Place 1 suppository (10 mg total) rectally as needed for constipation.      calcitonin (salmon) 200 UNIT/ACT nasal spray   Commonly known as: MIACALCIN/FORTICAL   Place 1 spray into the nose daily. Alternate nostrils daily      cephALEXin 250 MG/5ML suspension   Commonly known as: KEFLEX   Take 5 mLs (250 mg total) by mouth every 12 (twelve) hours.      cholecalciferol 1000 UNITS tablet   Commonly known as: VITAMIN D   Take 2,000 Units by mouth daily.       DSS 100 MG Caps   Take 100 mg by mouth 2 (two) times daily.      ferrous sulfate 325 (65 FE) MG tablet   Take 325 mg by mouth daily with breakfast.      HYDROcodone-acetaminophen 5-325 MG per tablet   Commonly known as: NORCO/VICODIN   Take 1 tablet by mouth every 6 (six) hours as needed.      levothyroxine 112 MCG tablet   Commonly known as: SYNTHROID, LEVOTHROID   Take 112 mcg by mouth at bedtime.      multivitamin with minerals Tabs   Take 1 tablet by mouth daily.      naphazoline-glycerin 0.012-0.2 % Soln   Commonly known as: CLEAR EYES   Place 1-2 drops into both eyes every 3 (three) hours as needed (itchy eyes).      senna 8.6 MG Tabs   Commonly known as: SENOKOT   Take 1 tablet by mouth at bedtime.      sertraline 25 MG tablet   Commonly known as: ZOLOFT   Take 25 mg by mouth daily.      timolol 0.5 % ophthalmic solution   Commonly known as: TIMOPTIC   Place 1 drop into both eyes at bedtime.      Travoprost (BAK Free) 0.004 % Soln ophthalmic solution   Commonly known as: TRAVATAN   Place 1 drop into both eyes at bedtime.           Follow-up Information    Schedule an appointment as soon as possible for a visit with Janee Morn, DAVID A., MD. (2 weeks from surgery)    Contact information:   [address] [place] [address] [phone]       Follow up with Gwen Pounds, MD. (As needed)    Contact information:   [address] [facility] [place] [address] [phone]       Please follow up. (Follow up with SNF MD. )           The results of significant diagnostics from this hospitalization (including imaging, microbiology, ancillary and laboratory) are listed below for reference.    Significant Diagnostic Studies: Dg Femur Left  05/13/2012  *RADIOLOGY REPORT*  Clinical Data: Larey Seat.  LEFT FEMUR - 2 VIEW  Comparison: Left hip films same date.  Findings: Left femoral neck fracture with major fracture  fragments in varus angulation.  No other femur fracture is noted. No obvious knee fracture.  If there is knee pain, standard knee films recommended.  IMPRESSION: Left femoral neck fracture.   Original Report Authenticated By: Lacy Duverney, M.D.    Dg Abd 1 View  05/19/2012  *RADIOLOGY REPORT*  Clinical Data: Constipation.  Abdominal pain.  ABDOMEN - 1 VIEW  Comparison: CT scan from 09/06/2009  Findings: Supine abdomen shows mild diffuse gaseous colonic distention with mild gaseous distention of small  bowel and left upper quadrant.  No evidence for an overt bowel obstruction.  The convex leftward lumbar scoliosis noted with diffuse bony demineralization.  The patient is status post left hip hemiarthroplasty.  IMPRESSION: Mild diffuse gaseous distention of colon.  Imaging features could be compatible with a degree of colonic ileus.   Original Report Authenticated By: Kennith Center, M.D.    Dg Pelvis Portable  05/14/2012  *RADIOLOGY REPORT*  Clinical Data: Post left hip arthroplasty  PORTABLE PELVIS  Comparison: Left hip radiographs - 05/13/2012  Findings:  Post left hip hemiarthroplasty. Alignment appears near anatomic on this AP projection radiograph.  No definite fracture.  There is a minimal amount of expected subcutaneous emphysema and soft tissue stranding about the operative site.  Skin staples are seen about the lateral aspect of the thigh.  No radiopaque foreign body.  IMPRESSION: Post left hip hemiarthroplasty without definite evidence of complication.   Original Report Authenticated By: Tacey Ruiz, MD    Dg Chest Port 1 View  05/17/2012  *RADIOLOGY REPORT*  Clinical Data: Congestive heart failure  PORTABLE CHEST - 1 VIEW  Comparison: 05/13/2012  Findings: The cardiac shadow is stable.  Mild central vascular congestion remains without significant pulmonary edema.  No focal infiltrate or sizable effusion is seen.  No bony abnormality is noted.  IMPRESSION: Stable appearance when compared with the prior  exam.   Original Report Authenticated By: Alcide Clever, M.D.    Dg Chest Port 1 View  05/13/2012  *RADIOLOGY REPORT*  Clinical Data: Hip fracture  PORTABLE CHEST - 1 VIEW  Comparison: 05/15/2010  Findings: Study is limited by poor inspiration.  There is central vascular congestion.  No acute infiltrate or pulmonary edema.  Mild bronchitic changes.  Atherosclerotic calcifications of thoracic aorta.  IMPRESSION: Central vascular congestion without pulmonary edema. Mild bronchitic changes.   Original Report Authenticated By: Natasha Mead, M.D.    Dg Hip Portable 1 View Left  05/13/2012  *RADIOLOGY REPORT*  Clinical Data: Question hip fracture  PORTABLE LEFT HIP - 1 VIEW  Comparison: None.  Findings: Single portable view of the left hip submitted.  There is displaced fracture of the left femoral neck.  IMPRESSION: Displaced fracture of the left femoral neck.  This was made a call report.   Original Report Authenticated By: Natasha Mead, M.D.     Microbiology: Recent Results (from the past 240 hour(s))  URINE CULTURE     Status: Normal   Collection Time   05/13/12  4:34 PM      Component Value Range Status Comment   Specimen Description URINE, CLEAN CATCH   Final    Special Requests NONE   Final    Culture  Setup Time 05/13/2012 17:54   Final    Colony Count 50,000 COLONIES/ML   Final    Culture     Final    Value: Multiple bacterial morphotypes present, none predominant. Suggest appropriate recollection if clinically indicated.   Report Status 05/14/2012 FINAL   Final   MRSA PCR SCREENING     Status: Normal   Collection Time   05/13/12  4:35 PM      Component Value Range Status Comment   MRSA by PCR NEGATIVE  NEGATIVE Final   URINE CULTURE     Status: Normal   Collection Time   05/19/12  4:04 PM      Component Value Range Status Comment   Specimen Description URINE, CATHETERIZED   Final    Special Requests Normal  Final    Culture  Setup Time 05/19/2012 16:50   Final    Colony Count >=100,000  COLONIES/ML   Final    Culture ESCHERICHIA COLI   Final    Report Status 05/21/2012 FINAL   Final    Organism ID, Bacteria ESCHERICHIA COLI   Final      Labs: Basic Metabolic Panel:  Lab 05/21/12 1308 05/20/12 0635 05/19/12 1538 05/19/12 1305 05/18/12 0422 05/17/12 1509  NA 136 135 -- 136 137 138  K 3.7 4.2 -- 4.2 4.5 4.1  CL 101 100 -- 103 103 104  CO2 26 24 -- 25 23 26   GLUCOSE 102* 146* -- 96 100* 101*  BUN 27* 26* -- 28* 30* 35*  CREATININE 1.07 0.93 -- 1.01 1.06 1.31*  CALCIUM 8.3* 8.6 -- 8.2* 8.3* 8.3*  MG -- -- 2.3 -- -- --  PHOS -- -- -- -- -- --   Liver Function Tests: No results found for this basename: AST:5,ALT:5,ALKPHOS:5,BILITOT:5,PROT:5,ALBUMIN:5 in the last 168 hours No results found for this basename: LIPASE:5,AMYLASE:5 in the last 168 hours No results found for this basename: AMMONIA:5 in the last 168 hours CBC:  Lab 05/21/12 0617 05/20/12 0635 05/19/12 1305 05/18/12 0422 05/17/12 1509  WBC 8.0 10.5 8.2 7.8 8.7  NEUTROABS -- -- -- -- --  HGB 9.2* 10.1* 9.3* 8.5* 9.0*  HCT 27.3* 30.0* 27.9* 26.3* 26.7*  MCV 90.4 90.4 91.5 92.0 91.8  PLT 262 238 180 134* 124*   Cardiac Enzymes: No results found for this basename: CKTOTAL:5,CKMB:5,CKMBINDEX:5,TROPONINI:5 in the last 168 hours BNP: BNP (last 3 results) No results found for this basename: PROBNP:3 in the last 8760 hours CBG:  Lab 05/19/12 1505  GLUCAP 91       Signed:  Veria Stradley  [facility] 05/21/2012, 10:43 AM

## 2012-05-21 NOTE — Progress Notes (Signed)
Patient discharged in stable condition via ambulance to SNF. 

## 2012-05-21 NOTE — Discharge Summary (Deleted)
Physician Discharge Summary  GAYLEN PEREIRA XLK:[id number] DOB: 11-Oct-1914 DOA: 05/13/2012  PCP: Gwen Pounds, MD  Admit date: 05/13/2012 Discharge date: 05/21/2012  Time spent: 40 minutes  Recommendations for Outpatient Follow-up:  1. Follow up with PMD. 2. Follow up with Dr Sheilah Mins, orthopedic surgeon, in 2 weeks.  3. Follow up with SNF MD.  4. Please encourage oral intake.  5. Please avoid opioids as possible. 6. Doculax suppository PRN for constipation.  7. Repeat CBC and B-met.   Discharge Diagnoses:   Fall  Closed left hip fracture  Aortic stenosis  Hypothyroidism  Urolithiasis  History of CVA (cerebrovascular accident)  Dementia  Anemia  E coli, UTI (lower urinary tract infection)  Encephalopathy  Ileus   Discharge Condition: Stable.   Diet recommendation: Dysphagia-1/Thin Liquids.   Filed Weights   05/13/12 1148  Weight: 61.236 kg (135 lb)    History of present illness:  79 year old female resident of [address] of [place], ALF in [place], with known history of Hypothyroidism, Glaucoma, open angle, Hyperlipidemia, Kidney stones, Macular degeneration, Osteoarthritis, s/p left internal capsule CVA 05/2003, severe aortic stenosis (Valve area 0.72) and aortic insufficiency, s/p benign breast biopsy/lumpectomy, s/p cholecystectomy, s/p cataract surgery/intraocular lens implants, dementia with sun-downing, transferred from [facility], per family request, following a fall. Precise circumstances are unclear, but patient's daughter [name] (Tel: [phone]) was contacted at 4:00 AM on 05/13/12, by staff at ALF, to inform her that patient had fallen, while in the bathroom. She was taken to [facility], where imaging studies revealed a left femoral neck fracture.   Hospital Course:  1. Fall: Patient presented following what appears to have been a mechanical fall, although precise circumstances are unclear at this time. This was complicated by  a left hip fracture. See below.  2. Closed left hip fracture: This is secondary to #1. Patient was ambulant prior to fall, but was clearly at very high risk for surgical intervention, given known history of severe aortic stenosis. Dr Rollene Rotunda provided cardiology consultation, and opined that patient has mild LV dysfunction and severe/critical aortic stenosis, not amenable to mitigation with any medical or interventional therapy at this point, and therefore, at high risk for surgery, based on her age, frailty and AS. Family opted to proceed with high risk surgery as quality of life is paramount in their minds. Dr Mack Hook provided orthopedic consultation, and patient underwent left hip hemiarthroplasty on 05/14/12, she remained stable from this view point, thereafter. . [name]. For PT/OT/Rehab at [facility].   3. Anemia: HB at presentation was 12.8, and post-operatively, dropped to 9.5 as of 05/15/12. This was ABLA, due to surgery. HB dropped further to 7.4 on 05/16/12 and patient was transfused 1 unit PRBC on that date, with satisfactory bump in HB to 9.2. HB has since remained stable. Ferrous sulfate daily.  4. Aortic stenosis: As described above, patient has known severe aortic stenosis, with valvular area of 0.72, when evaluated by Dr Arvilla Meres in 2012. At presentation, she had no symptoms of chest pain or overt CHF. 2D Echocardiogram of 05/13/12, showed mild LVH, EF of 60% to 65%, severely thickened and calcified aortic valve. Peak and mean gradients through the valve were 120 and 80 mm Hg respectively consistent with severe/critical AS. Mild regurgitation. She has remained stable clinically during her hosptalization, with close attention paid to volume status. CXR of 05/17/12, was stable, without evidence of overt pulmonary edema.   5. Dehydration: Patient appeared  clinically mildly dehydrated at presentation, although lab work was not immediately available at that time. Managed with  gentle iv fluids. Encouraging oral fluids, but intake is poor. SLP evaluated on 05/17/12, and has recommended D1/Thin liquids. IV fluids were discontinued on 05/18/12. Encourage oral intake.   6. Hypothyroidism: Continued on thyroxine replacement therapy, initially iv, but transitioned to oral formulation on 05/15/12.  7. Urolithiasis: Per history. This does not appear problematic at this time. Urinalysis was negative for UTI. Urine culture grew multiple morphotypes, suggestive of contamination. Patient remained afebrile and wcc, normal.  8. History of CVA (cerebrovascular accident): Stable.  9. Dementia: Known dementia, with episodes of sun-downing. Stable.     Patient was suppose to be discharge 2-05, but discharge was delay because patient develop colonic ileus vs constipation, UTI and delirious.   10.Encephalopathy: delirium vs secondary to infection vs medications narcotics.Marland Kitchen UA positive for nitrates. Treating for infection, ileus. Avoid narcotics. Change ciprofloxacin to keflex.. Frequent orientation. Patient today more awake, answering some question, no hallucinations.   11-Abdominal pain/Constipation: KUB colonic ileus vs constipation. Appreciate Dr Dulce Sellar help. Had 2 BM on the 5 th  and 1 BM yesterday per nurse report.. No electrolytes abnormalities. Docusate BID. Doculax suppository PRN.   12-UTI: UA with 11-20 WBC. Need to monitor urine out put, bladder scan as needed. Urine culture E coli sensitive to cephalosporine. Will discontinue ciprofloxacin it can cause delirium in the elderly. Continue with keflex. Day 3 antibiotics. Tx for total 7 days.     Procedures:  See Below.   2D Echocardiogram.   Consultations:  Dr Rollene Rotunda, cardiologist.   Dr Mack Hook, orthopedic surgeon.   Discharge Exam: Filed Vitals:   05/20/12 0500 05/20/12 1300 05/20/12 2126 05/21/12 0616  BP: 142/74 132/42 131/44 137/40  Pulse: 71 78 84 71  Temp: 98.7 F (37.1 C) 97.4 F (36.3 C) 97.7 F  (36.5 C) 98.1 F (36.7 C)  TempSrc: Oral  Oral Oral  Resp: 18 18 18 18   Height:      Weight:      SpO2: 97% 100% 98% 97%    General: Alert, not in acute discomfort, not short of breath at rest. Following simple commands accurately.  HEENT: Mild clinical pallor, no jaundice, no conjunctival injection or discharge.  NECK: Supple, JVP not seen, no carotid bruits, no palpable lymphadenopathy, no palpable goiter.  CHEST: Clinically clear to auscultation, no wheezes, no crackles.  HEART: Sounds 1 and 2 heard, normal, regular, high-pitched late systolic murmur.  ABDOMEN: Full, soft, non-tender, no palpable organomegaly, no palpable masses, normal bowel sounds.  GENITALIA: Not examined.  LOWER EXTREMITIES: No pitting edema, palpable peripheral pulses.  MUSCULOSKELETAL SYSTEM: Surgical site appears clean and dry.  CENTRAL NERVOUS SYSTEM: No focal neurologic deficit on gross examination.  Discharge Instructions      Discharge Orders    Future Orders Please Complete By Expires   Diet general      Weight bearing as tolerated      Change dressing      Scheduling Instructions:   Change dressing daily until no more spotting or drainage, then may d/c dressing.   Increase activity slowly          Medication List     As of 05/21/2012 10:26 AM    TAKE these medications         aspirin 325 MG tablet   Take 325 mg by mouth daily.      calcitonin (salmon) 200 UNIT/ACT nasal spray  Commonly known as: MIACALCIN/FORTICAL   Place 1 spray into the nose daily. Alternate nostrils daily      cholecalciferol 1000 UNITS tablet   Commonly known as: VITAMIN D   Take 2,000 Units by mouth daily.      ferrous sulfate 325 (65 FE) MG tablet   Take 325 mg by mouth daily with breakfast.      HYDROcodone-acetaminophen 5-325 MG per tablet   Commonly known as: NORCO/VICODIN   Take 1 tablet by mouth every 4 (four) hours as needed for pain.      levothyroxine 112 MCG tablet   Commonly known as:  SYNTHROID, LEVOTHROID   Take 112 mcg by mouth at bedtime.      multivitamin with minerals Tabs   Take 1 tablet by mouth daily.      senna 8.6 MG Tabs   Commonly known as: SENOKOT   Take 1 tablet by mouth at bedtime.      sertraline 25 MG tablet   Commonly known as: ZOLOFT   Take 25 mg by mouth daily.      timolol 0.5 % ophthalmic solution   Commonly known as: TIMOPTIC   Place 1 drop into both eyes at bedtime.      Travoprost (BAK Free) 0.004 % Soln ophthalmic solution   Commonly known as: TRAVATAN   Place 1 drop into both eyes at bedtime.         Follow-up Information    Schedule an appointment as soon as possible for a visit with Janee Morn, DAVID A., MD. (2 weeks from surgery)    Contact information:   [address] [place] [address] [phone]       Follow up with Gwen Pounds, MD. (As needed)    Contact information:   [address] [facility] [place] [address] [phone]       Please follow up. (Follow up with SNF MD. )           The results of significant diagnostics from this hospitalization (including imaging, microbiology, ancillary and laboratory) are listed below for reference.    Significant Diagnostic Studies: Dg Femur Left  05/13/2012  *RADIOLOGY REPORT*  Clinical Data: Larey Seat.  LEFT FEMUR - 2 VIEW  Comparison: Left hip films same date.  Findings: Left femoral neck fracture with major fracture fragments in varus angulation.  No other femur fracture is noted. No obvious knee fracture.  If there is knee pain, standard knee films recommended.  IMPRESSION: Left femoral neck fracture.   Original Report Authenticated By: Lacy Duverney, M.D.    Dg Pelvis Portable  05/14/2012  *RADIOLOGY REPORT*  Clinical Data: Post left hip arthroplasty  PORTABLE PELVIS  Comparison: Left hip radiographs - 05/13/2012  Findings:  Post left hip hemiarthroplasty. Alignment appears near anatomic on this AP projection radiograph.  No  definite fracture.  There is a minimal amount of expected subcutaneous emphysema and soft tissue stranding about the operative site.  Skin staples are seen about the lateral aspect of the thigh.  No radiopaque foreign body.  IMPRESSION: Post left hip hemiarthroplasty without definite evidence of complication.   Original Report Authenticated By: Tacey Ruiz, MD    Dg Chest Port 1 View  05/17/2012  *RADIOLOGY REPORT*  Clinical Data: Congestive heart failure  PORTABLE CHEST - 1 VIEW  Comparison: 05/13/2012  Findings: The cardiac shadow is stable.  Mild central vascular congestion remains without significant pulmonary edema.  No focal infiltrate or sizable effusion is  seen.  No bony abnormality is noted.  IMPRESSION: Stable appearance when compared with the prior exam.   Original Report Authenticated By: Alcide Clever, M.D.    Dg Chest Port 1 View  05/13/2012  *RADIOLOGY REPORT*  Clinical Data: Hip fracture  PORTABLE CHEST - 1 VIEW  Comparison: 05/15/2010  Findings: Study is limited by poor inspiration.  There is central vascular congestion.  No acute infiltrate or pulmonary edema.  Mild bronchitic changes.  Atherosclerotic calcifications of thoracic aorta.  IMPRESSION: Central vascular congestion without pulmonary edema. Mild bronchitic changes.   Original Report Authenticated By: Natasha Mead, M.D.    Dg Hip Portable 1 View Left  05/13/2012  *RADIOLOGY REPORT*  Clinical Data: Question hip fracture  PORTABLE LEFT HIP - 1 VIEW  Comparison: None.  Findings: Single portable view of the left hip submitted.  There is displaced fracture of the left femoral neck.  IMPRESSION: Displaced fracture of the left femoral neck.  This was made a call report.   Original Report Authenticated By: Natasha Mead, M.D.     Microbiology: Recent Results (from the past 240 hour(s))  URINE CULTURE     Status: Normal   Collection Time   05/13/12  4:34 PM      Component Value Range Status Comment   Specimen Description URINE, CLEAN CATCH    Final    Special Requests NONE   Final    Culture  Setup Time 05/13/2012 17:54   Final    Colony Count 50,000 COLONIES/ML   Final    Culture     Final    Value: Multiple bacterial morphotypes present, none predominant. Suggest appropriate recollection if clinically indicated.   Report Status 05/14/2012 FINAL   Final   MRSA PCR SCREENING     Status: Normal   Collection Time   05/13/12  4:35 PM      Component Value Range Status Comment   MRSA by PCR NEGATIVE  NEGATIVE Final   URINE CULTURE     Status: Normal (Preliminary result)   Collection Time   05/19/12  4:04 PM      Component Value Range Status Comment   Specimen Description URINE, CATHETERIZED   Final    Special Requests Normal   Final    Culture  Setup Time 05/19/2012 16:50   Final    Colony Count >=100,000 COLONIES/ML   Final    Culture ESCHERICHIA COLI   Final    Report Status PENDING   Incomplete      Labs: Basic Metabolic Panel:  Lab 05/21/12 4782 05/20/12 0635 05/19/12 1538 05/19/12 1305 05/18/12 0422 05/17/12 1509  NA 136 135 -- 136 137 138  K 3.7 4.2 -- 4.2 4.5 4.1  CL 101 100 -- 103 103 104  CO2 26 24 -- 25 23 26   GLUCOSE 102* 146* -- 96 100* 101*  BUN 27* 26* -- 28* 30* 35*  CREATININE 1.07 0.93 -- 1.01 1.06 1.31*  CALCIUM 8.3* 8.6 -- 8.2* 8.3* 8.3*  MG -- -- 2.3 -- -- --  PHOS -- -- -- -- -- --   Liver Function Tests: No results found for this basename: AST:5,ALT:5,ALKPHOS:5,BILITOT:5,PROT:5,ALBUMIN:5 in the last 168 hours No results found for this basename: LIPASE:5,AMYLASE:5 in the last 168 hours No results found for this basename: AMMONIA:5 in the last 168 hours CBC:  Lab 05/21/12 0617 05/20/12 0635 05/19/12 1305 05/18/12 0422 05/17/12 1509  WBC 8.0 10.5 8.2 7.8 8.7  NEUTROABS -- -- -- -- --  HGB 9.2*  10.1* 9.3* 8.5* 9.0*  HCT 27.3* 30.0* 27.9* 26.3* 26.7*  MCV 90.4 90.4 91.5 92.0 91.8  PLT 262 238 180 134* 124*   Cardiac Enzymes: No results found for this basename:  CKTOTAL:5,CKMB:5,CKMBINDEX:5,TROPONINI:5 in the last 168 hours BNP: BNP (last 3 results) No results found for this basename: PROBNP:3 in the last 8760 hours CBG:  Lab 05/19/12 1505  GLUCAP 91       Signed:  Kenika Sahm  [facility] 05/21/2012, 10:26 AM

## 2012-05-21 NOTE — Progress Notes (Signed)
Physical Therapy Treatment Patient Details Name: [name] MRN: [id number] DOB: Dec 07, 1914 Today's Date: 05/21/2012 Time:  -     PT Assessment / Plan / Recommendation Comments on Treatment Session  Pt was unable to tolerate oob activity this session secondary to c/o fatigue pain and moderate dizziness.  Returned pt to supine and discontinued session.      Follow Up Recommendations  SNF;Supervision/Assistance - 24 hour     Does the patient have the potential to tolerate intense rehabilitation     Barriers to Discharge        Equipment Recommendations  None recommended by PT    Recommendations for Other Services    Frequency Min 3X/week   Plan Discharge plan remains appropriate;Frequency remains appropriate    Precautions / Restrictions Precautions Precautions: Anterior Hip Precaution Booklet Issued: Yes (comment) Precaution Comments: Pt unable to recall hip precautions Restrictions Weight Bearing Restrictions: Yes LLE Weight Bearing: Weight bearing as tolerated   Pertinent Vitals/Pain Pt c/o pain in hip but unable to qualify.     Mobility  Bed Mobility Bed Mobility: Supine to Sit;Sit to Supine Supine to Sit: 1: +2 Total assist Supine to Sit: Patient Percentage: 20% Sit to Supine: 1: +2 Total assist;HOB flat Sit to Supine: Patient Percentage: 20% Details for Bed Mobility Assistance: A for all aspects. Patient attempting to move LE but difficult due to increased pain Transfers Transfers: Not assessed Ambulation/Gait Ambulation/Gait Assistance: Not tested (comment) Wheelchair Mobility Wheelchair Mobility: No    Exercises     PT Diagnosis:    PT Problem List:   PT Treatment Interventions:     PT Goals Acute Rehab PT Goals PT Goal Formulation: With patient/family Time For Goal Achievement: 05/29/12 Potential to Achieve Goals: Fair Pt will go Supine/Side to Sit: with mod assist PT Goal: Supine/Side to Sit - Progress: Not progressing Pt will go Sit to  Supine/Side: with mod assist;with HOB 0 degrees PT Goal: Sit to Supine/Side - Progress: Not progressing Pt will go Sit to Stand: with mod assist PT Goal: Sit to Stand - Progress: Not progressing PT Goal: Stand to Sit - Progress: Not progressing PT Transfer Goal: Bed to Chair/Chair to Bed - Progress: Not progressing  Visit Information  Last PT Received On: 05/21/12 Assistance Needed: +2    Subjective Data  Patient Stated Goal: none stated.     Cognition  Cognition Overall Cognitive Status: Difficult to assess Difficult to assess due to: Hard of hearing/deaf;Level of arousal Arousal/Alertness: Lethargic Orientation Level: Appears intact for tasks assessed Behavior During Session: Lethargic Cognition - Other Comments: Pt HOH and requires cueing    Balance  Balance Balance Assessed: Yes Static Sitting Balance Static Sitting - Balance Support: Bilateral upper extremity supported;Feet supported Static Sitting - Level of Assistance: 4: Min assist Static Sitting - Comment/# of Minutes: pt falling asleep sitting on EOB leaning to the right.   End of Session PT - End of Session Activity Tolerance: Patient limited by fatigue;Patient limited by pain Patient left: in bed;with call bell/phone within reach;with bed alarm set Nurse Communication: Mobility status   GP     Margan Elias L. Ayyan Sites DPT 05/19/2012 14:00

## 2012-05-21 NOTE — Progress Notes (Signed)
Subjective: Several stools with dulcolax suppositories.  Objective: Vital signs in last 24 hours: Temp:  [97.4 F (36.3 C)-98.1 F (36.7 C)] 98.1 F (36.7 C) (02/07 0616) Pulse Rate:  [71-84] 71  (02/07 0616) Resp:  [18] 18  (02/07 0616) BP: (131-137)/(40-44) 137/40 mmHg (02/07 0616) SpO2:  [97 %-100 %] 97 % (02/07 0616) Weight change:  Last BM Date: 05/20/12  PE: GEN:  Confused, non-toxic appearing ABD:  Mild distention but soft, no peritonitis  Assessment:  1.  Constipation +/- ileus.  Likely multifactorial (post-operative, narcotics, decreased mobility).  Improving with conservative management.  Plan:  1.  Continue dulcolax suppositories as needed. 2.  Increasing mobility and decreasing narcotic requirement, as clinically feasible, will help as well. 3.  Will sign-off; please call with questions; thank you for the consult.   [name] 05/21/2012, 12:20 PM

## 2012-07-13 DEATH — deceased

## 2014-03-28 IMAGING — CR DG HIP COMPLETE 2+V*L*
1 series · 2 of 2 positions shown · non-contrast
Comparison: none

REASON FOR EXAM: fall with left hip pain, leg shortening and external
rotation
COMMENTS:

[Series 1: t hip ap left · 0.14mm/px · 2 of 2 slices shown]
[im 1/2]
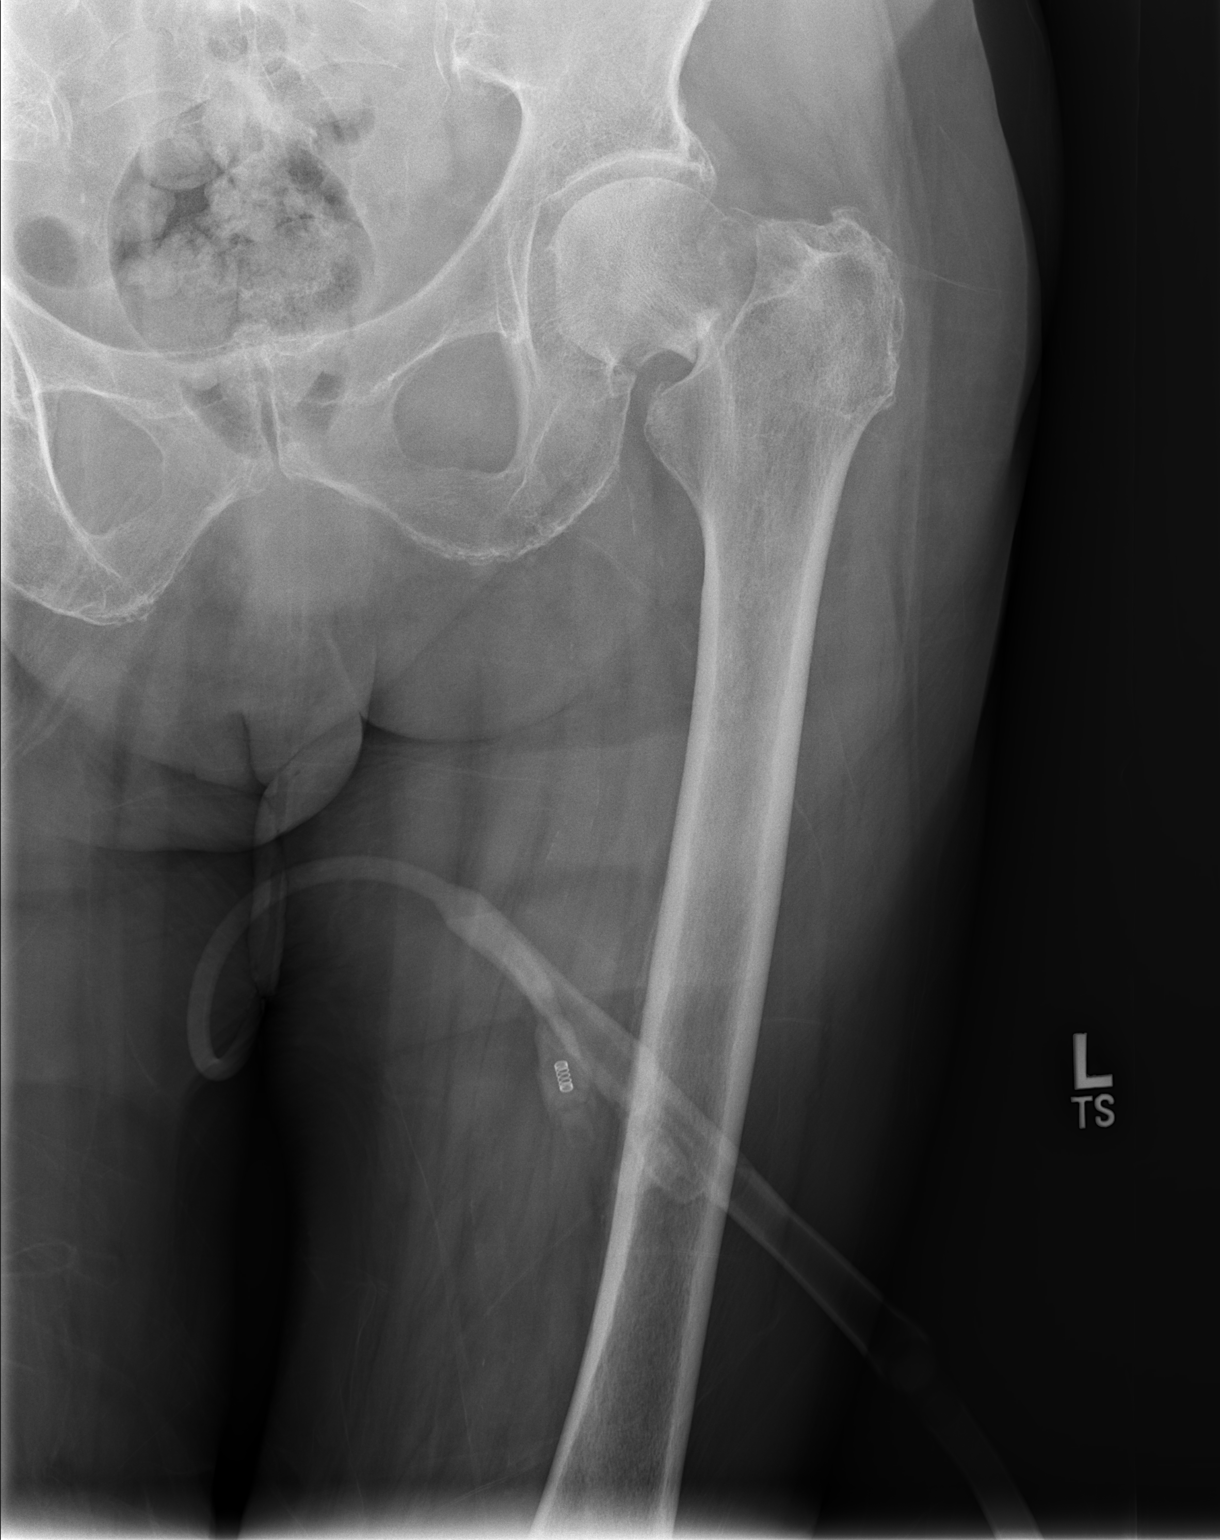
[im 2/2]
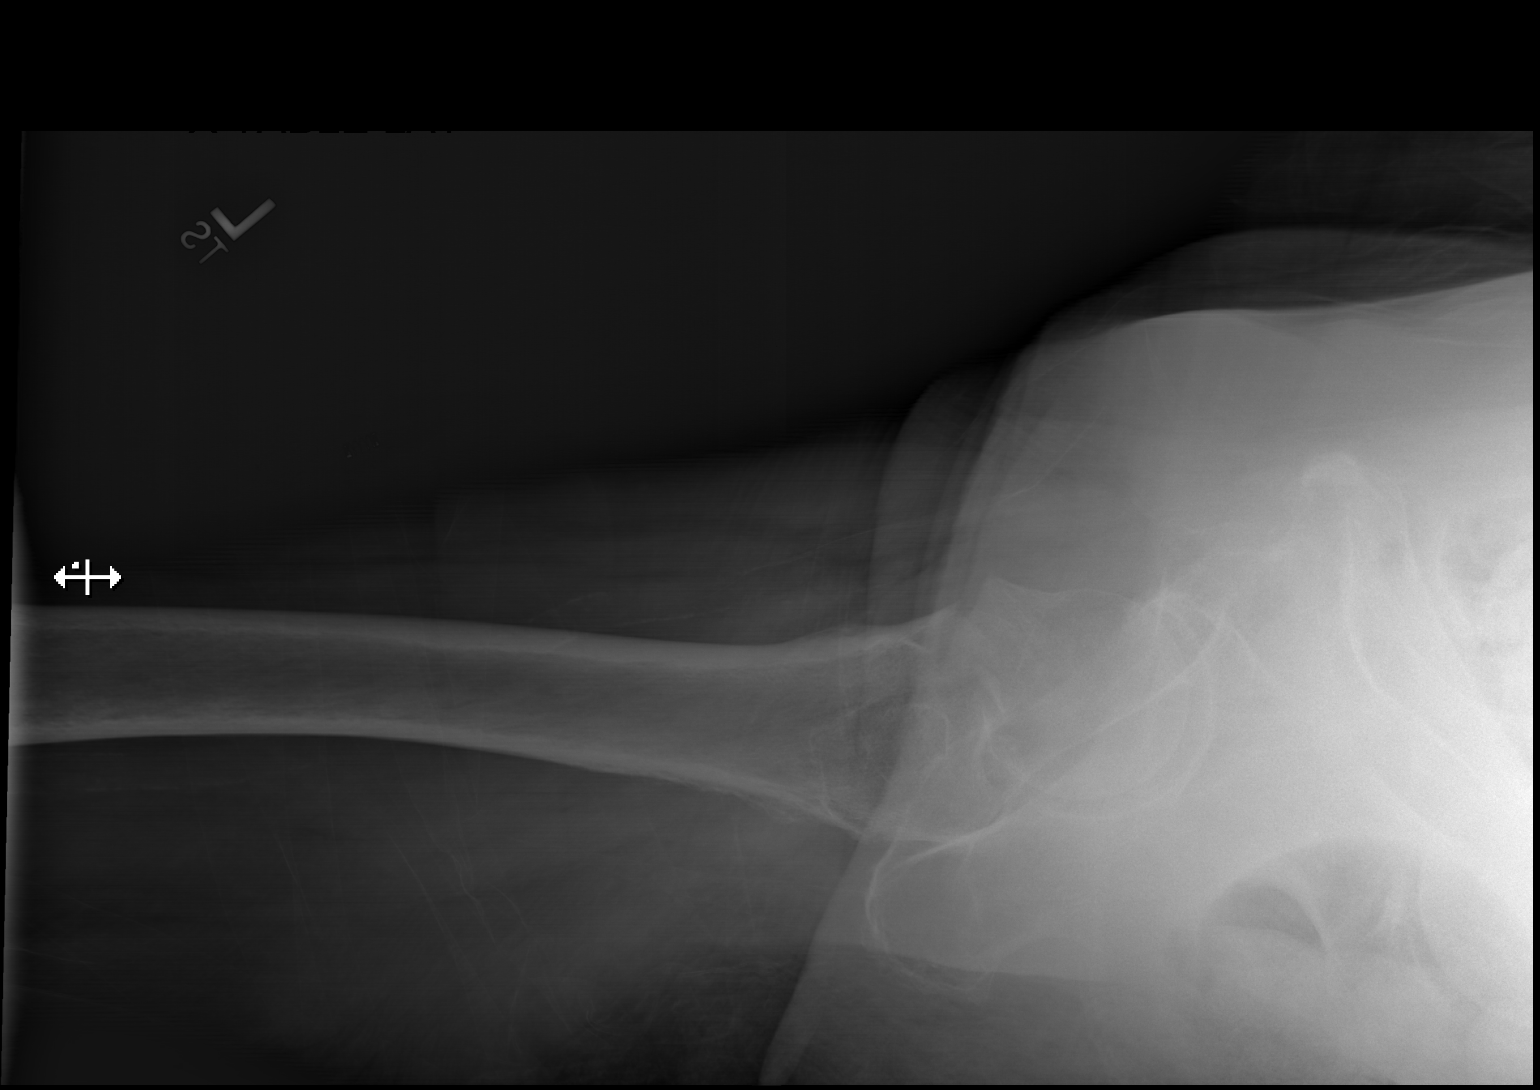

[2 of 2 positions shown; findings below may reference images not displayed]

PROCEDURE:     DXR - DXR HIP LEFT COMPLETE  - May 13, 2012  [DATE]

RESULT:     The patient has sustained an acute fracture of the neck of the
left femur. There is mild angulation present. No definite intertrochanteric
component is demonstrated. The observed portions of the left hemipelvis
appear normal.
IMPRESSION: The patient has sustained an acute fracture of the neck of
the left femur.

[REDACTED]

## 2014-03-28 IMAGING — CR DG HIP 1V PORT*L*
1 series · 1 of 1 positions shown · non-contrast
Comparison: None.

CLINICAL DATA: Question hip fracture

PORTABLE LEFT HIP - 1 VIEW

[AP]
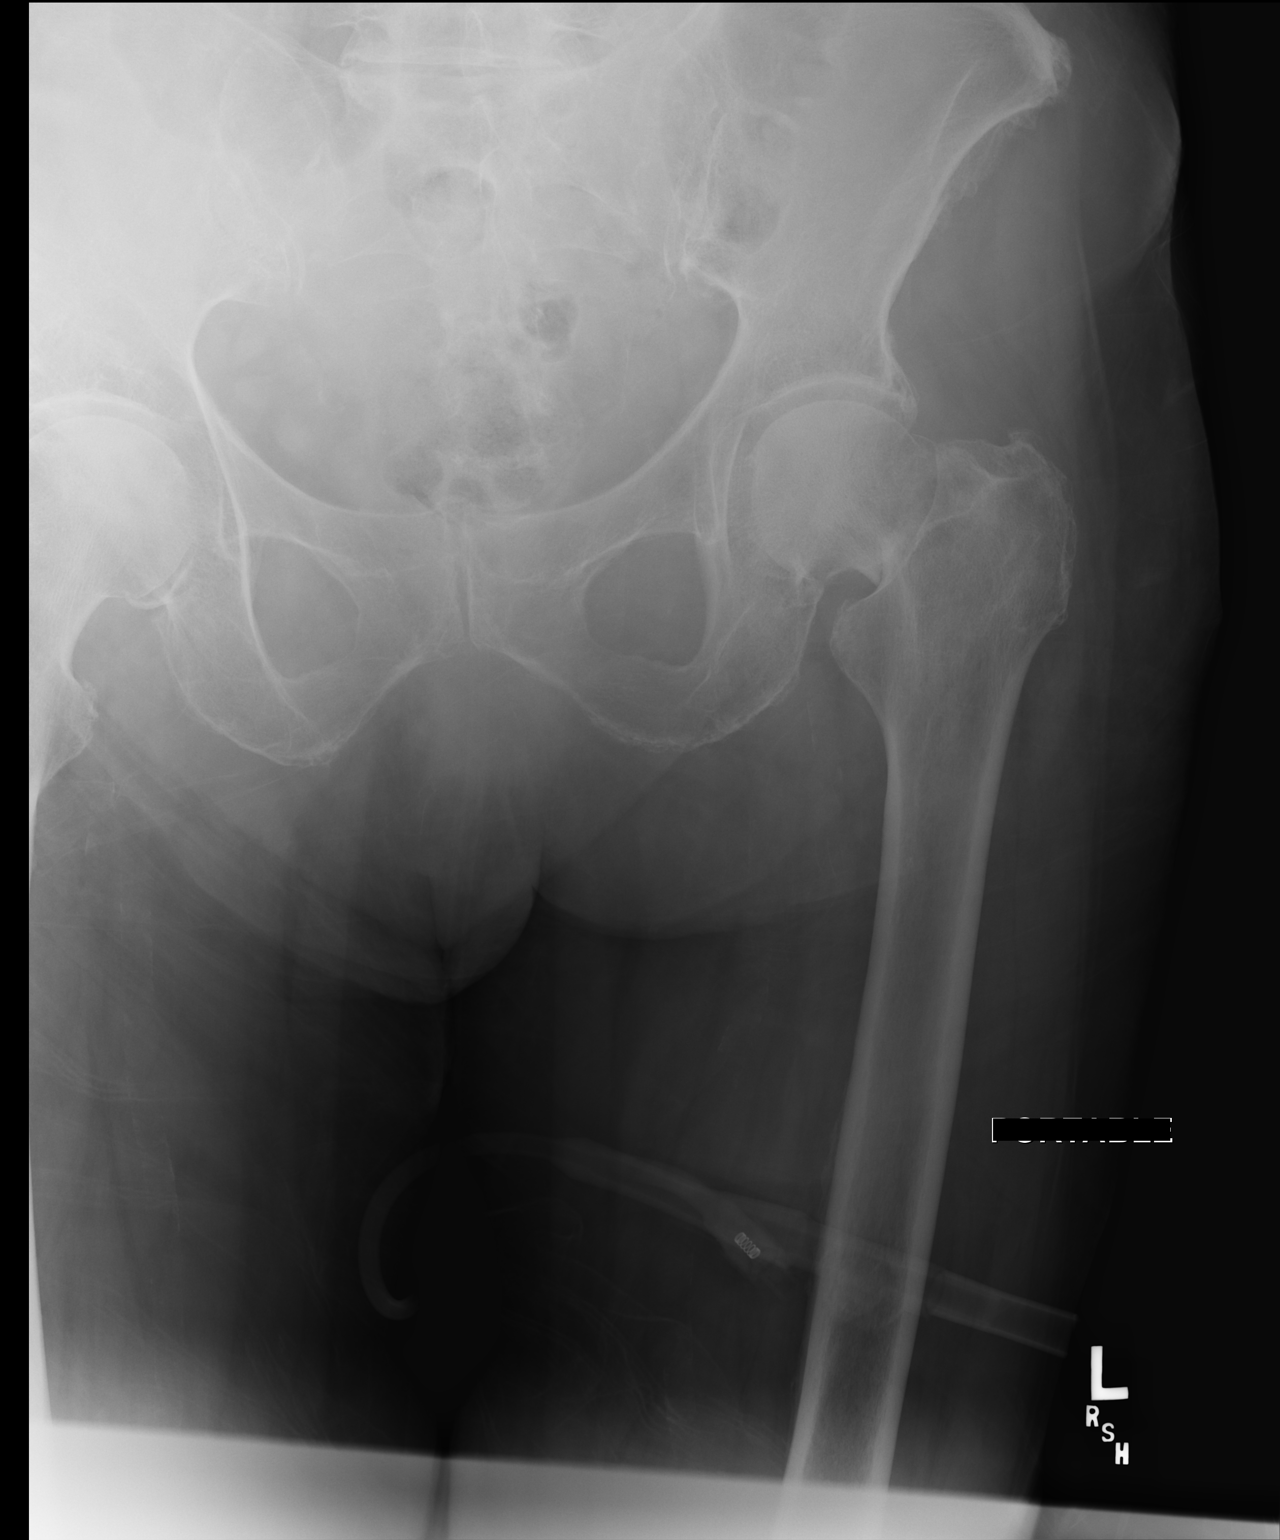

[1 of 1 positions shown; findings below may reference images not displayed]

FINDINGS: Single portable view of the left hip submitted.  There is
displaced fracture of the left femoral neck.
IMPRESSION: Displaced fracture of the left femoral neck.

This was made a call report.

## 2014-03-28 IMAGING — CR DG CHEST 1V PORT
1 series · 1 of 1 positions shown · non-contrast
Comparison: 05/15/2010

CLINICAL DATA: Hip fracture

PORTABLE CHEST - 1 VIEW

[AP]
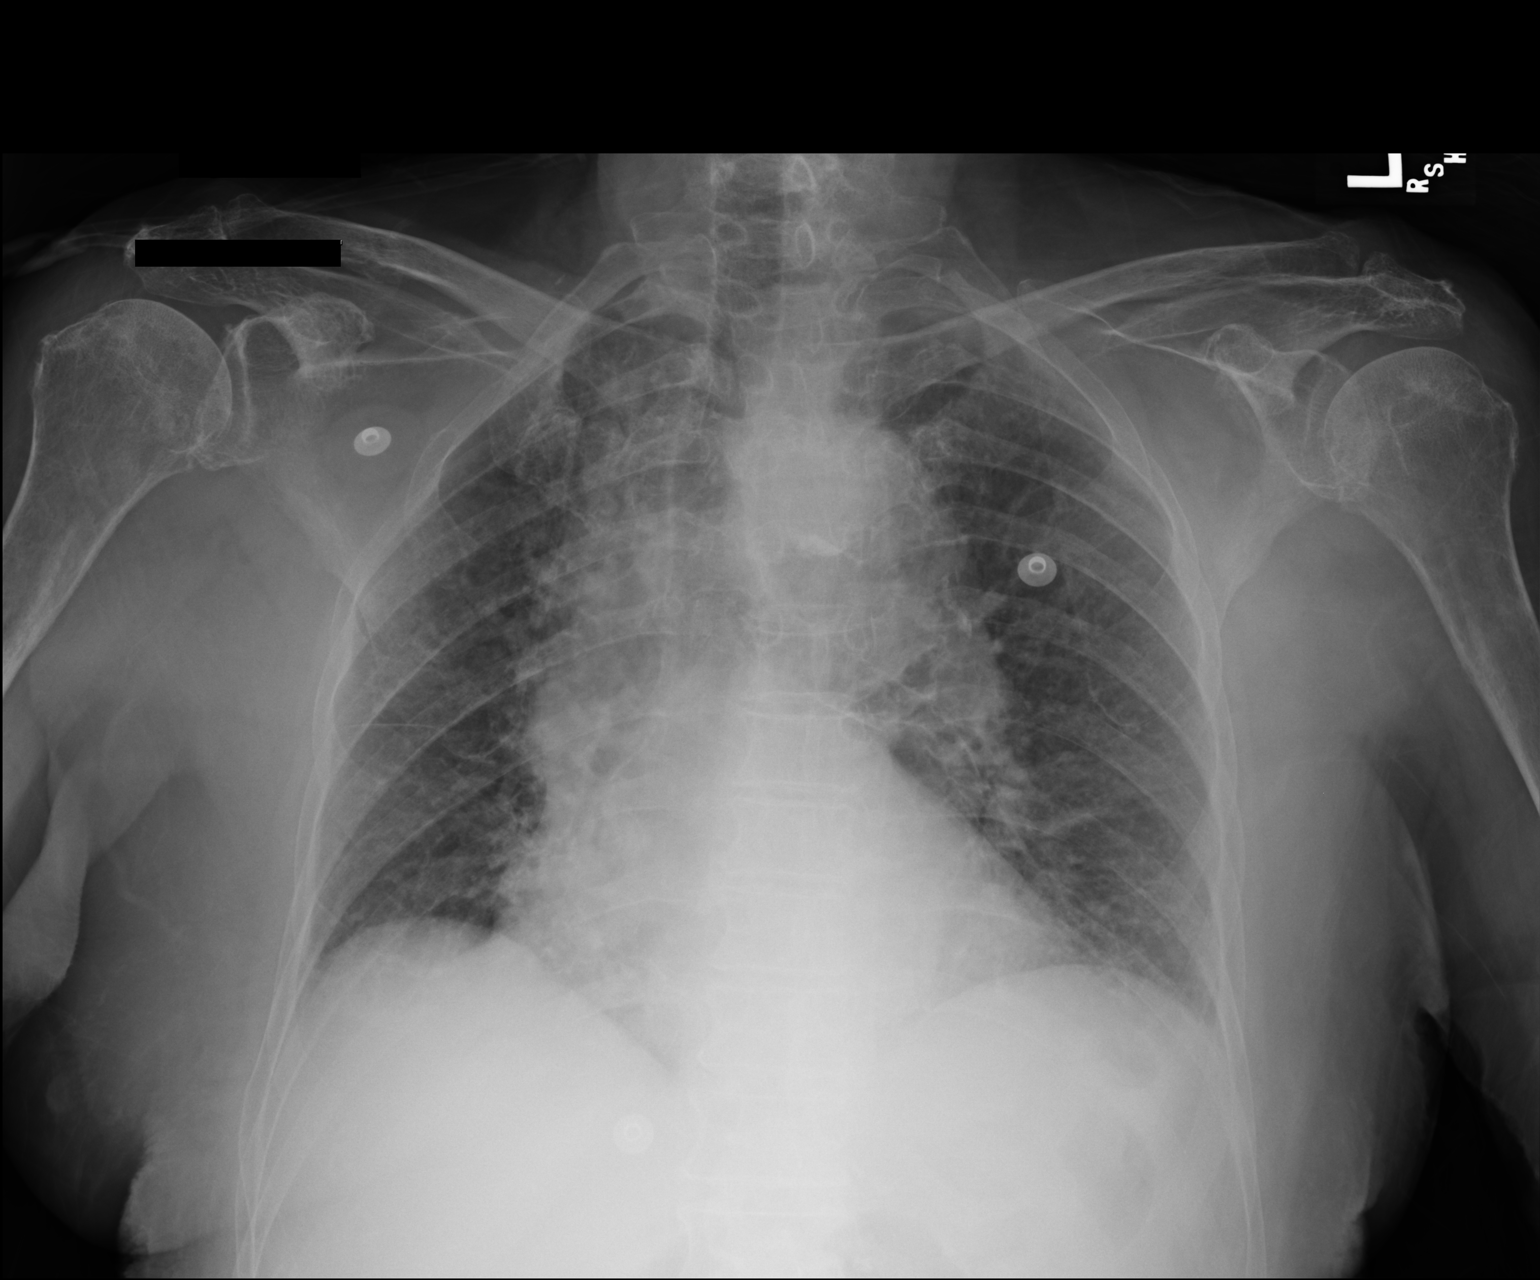

[1 of 1 positions shown; findings below may reference images not displayed]

FINDINGS: Study is limited by poor inspiration.  There is central
vascular congestion.  No acute infiltrate or pulmonary edema.  Mild
bronchitic changes.  Atherosclerotic calcifications of thoracic
aorta.
IMPRESSION: Central vascular congestion without pulmonary edema.
Mild bronchitic changes.

## 2014-08-04 NOTE — Consult Note (Signed)
PATIENT NAME:  [name], [name] MR#:  [id number] DATE OF BIRTH:  04-06-1915  DATE OF CONSULTATION:  05/13/2012  REFERRING PHYSICIAN:  Kennedy BuckerMichael Menz, MD CONSULTING PHYSICIAN:  Gina Armsawood Acosta. Baili Stang, MD  PRIMARY CARE PHYSICIAN: Gina CornJohn Russo, MD  REASON FOR MEDICAL CONSULTATION: Preoperative evaluation for left hip replacement surgery.   HISTORY OF PRESENT ILLNESS: This is a 81 year old female who lives at [facility], who presents with mechanical fall, as she was going to the restroom at night. She fell and she hurt her left hip area. X-ray does show left hip fracture. The patient has been admitted to orthopedic service and medical consult is called for preop evaluation. The patient is known to have history of critical aortic stenosis with the last valve area of 0.74, in 2008. As well has known history of dementia, CVA, hypothyroidism, failure to thrive and other multiple medical issues. The patient had basic workup done which shows normal leukocytosis, no anemia and creatinine of 1.26. As well she had EKG done which did show sinus rhythm without significant ST or T wave changes. By reviewing the patient'Acosta previous medical record, at [facility], it appears last echo was done in 2008 showing her to have critical aortic stenosis with valve area of 0.74, which was deemed inoperable then. The patient was mildly hypoxic upon sleeping with oxygenation of 88, where she did require some oxygen, where currently she is saturating well on 2 liters nasal cannula. The patient is extremely hard of hearing, denies any chest pain, as well denies any shortness of breath and denies any loss of consciousness, but she is extremely hard of hearing and poor historian as well.   PAST MEDICAL HISTORY: 1. Critical aortic stenosis with mild aortic insufficiency. Last valve area was 0.74, on echo done in 2008.  2. Failure to thrive.  3. Deconditioning.  4. Dementia.  5. Depression.  6. Iron deficiency anemia.   7. 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 8. Open angle glaucoma.  9. Hyperlipidemia.  10. History of nephrolithiasis. 11. Macular degeneration.  12. Osteoarthritis.  13. Cataract surgery.  14. Benign breast biopsy.  15. Osteoporosis.  16. Vitamin D deficiency.  17. History of UTI.   18. History of L1 compression fracture.   ALLERGIES: PENICILLIN, FLU, STREP AND TRAMADOL.   HOME MEDICATIONS: 1. DuoNebs every 6 hours as needed.  2. Meclizine 12.5 mg 1 tablet every 8 hours as needed.  3. MiraLAX daily as needed.  4. Promethazine 25 mg tablet every 6 hours as needed.  5. Senokot 1 tablet by mouth in the evening.  6. Aspirin 325 mg daily.  7. Calcitonin salmon 200 units one spray in one nostril daily, alternating nostrils every other day.  8. Oxygen 2 liters nasal cannula continuous.  9. Ferrous sulfate 325 mg daily.  10. Multivitamin 1 tablet daily.  11. Zoloft 25 mg daily.  12. Vitamin D3 2 tablets by mouth daily. 13. Levothyroxine 112 mcg at bedtime.  14. Timolol 0.5 eyedrops both eyes at bedtime.  15. Travoprost 0.04 one drop both eyes at bedtime.   SOCIAL HISTORY: The patient resides at [facility], in [place]. She does not smoke.   FAMILY HISTORY: Significant for CVA and diabetes and coronary artery disease in the family.      REVIEW OF SYSTEMS:  CONSTITUTIONAL: The patient is hard of hearing and an extremely poor historian. She denies any fever or chills, weight gain or weight loss.  EYES: History of glaucoma and cataract surgery. Denies any inflammation.  ENT: Denies ear pain, epistaxis or nasal discharge. Is hard of hearing.  RESPIRATORY: Denies any cough, productive sputum or hemoptysis. Using oxygen at baseline. CARDIOVASCULAR: Denies chest pain, edema, palpitations, syncope, altered mental status or shortness of breath.  GASTROINTESTINAL: Denies nausea, vomiting, diarrhea, abdominal pain or hematemesis.  GENITOURINARY: Denies  dysuria, hematuria or renal colic.  ENDOCRINE: Denies polyuria, polydipsia, heat or cold intolerance.  HEMATOLOGY: Denies easy bruising or bleeding diathesis.  INTEGUMENTARY: Denies acne, rash or skin lesions.  MUSCULOSKELETAL: Complains of left hip pain and left knee pain as well. Has history of arthritis.  NEURO: Known history of CVA and dementia.   PHYSICAL EXAMINATION: VITAL SIGNS: Temperature 98.4, pulse 68, respiratory rate 16, blood pressure 159/63 and saturating 87% on room air, currently 94% on 2 liters nasal cannula.  GENERAL: Frail, elderly female who looks comfortable, in no apparent distress.  HEENT: Head atraumatic, normocephalic. Pink conjunctivae. Anicteric sclerae. Moist oral mucosa.  NECK: Supple. No thyromegaly. No JVD.  CHEST: Good air entry bilaterally with bibasilar crackles.  CARDIOVASCULAR: S1, S2 heard with systolic ejection murmur.  ABDOMEN: Soft, nontender and nondistended. Bowel sounds present.  EXTREMITIES: Has good pulses bilaterally with left lower extremity rotated externally and shorter than the right, complains of right hip area pain.  PSYCHIATRIC: Awake, communicative x 2. SKIN: Has bruise on the left knee area.   PERTINENT LABS: Glucose 90, BUN 19, creatinine 1.26, sodium 138, potassium 4.1, chloride 102 and CO2 26. White blood cells 6.7, hemoglobin 11.6, hematocrit 35.8 and platelets 197. INR 0.9.     ASSESSMENT AND PLAN: 1. Status post fall with left hip fracture, preoperative evaluation. The patient is known to have history of critical aortic stenosis with last valve area 0.74 on echo done in 2008, so the patient is high risk for surgery. She needs cardiology evaluation and workup prior to proceeding with surgery as she is high risk for surgery and so far she is not cleared. As well, given her hypoxia, which appears the patient is on 2 liters baseline at the nursing home, will need further evaluation. We will get chest x-ray and BNP and 2-D  echocardiogram as well. 2. History of cerebrovascular accident in the past. Continue with aspirin.  3. History of iron deficiency anemia. Continue with iron supplement.  4. Hypothyroidism. Continue with Synthroid.  5. Osteoporosis. Continue with calcium supplement.  6. Deep vein thrombosis prophylaxis. As per primary orthopedic team.  7. GI prophylaxis. May start on PPI.   CODE STATUS: The patient has a DNR sheet from the nursing home.   TOTAL TIME SPENT ON MEDICAL CONSULT: 45 minutes. ____________________________ Gina Arms, MD dse:sb D: 05/13/2012 07:13:24 ET T: 05/13/2012 09:09:13 ET JOB#: [id number]  cc: Gina Arms, MD, <Dictator> Avo Schlachter Teena Irani MD ELECTRONICALLY SIGNED 05/17/2012 2:13
